# Patient Record
Sex: Female | Born: 1940
Health system: Southern US, Community
[De-identification: ages and names within clinical notes are randomized; demographics above are authoritative.]

## PROBLEM LIST (undated history)

## (undated) DIAGNOSIS — J309 Allergic rhinitis, unspecified: Secondary | ICD-10-CM

## (undated) HISTORY — DX: Allergic rhinitis, unspecified: J30.9

---

## 1999-10-13 ENCOUNTER — Encounter: Payer: Self-pay | Admitting: Internal Medicine

## 1999-10-13 ENCOUNTER — Encounter: Admission: RE | Admit: 1999-10-13 | Discharge: 1999-10-13 | Payer: Self-pay | Admitting: Internal Medicine

## 2000-12-04 ENCOUNTER — Encounter: Admission: RE | Admit: 2000-12-04 | Discharge: 2000-12-04 | Payer: Self-pay | Admitting: Internal Medicine

## 2000-12-04 ENCOUNTER — Encounter: Payer: Self-pay | Admitting: Internal Medicine

## 2001-12-21 ENCOUNTER — Encounter: Payer: Self-pay | Admitting: Internal Medicine

## 2001-12-21 ENCOUNTER — Encounter: Admission: RE | Admit: 2001-12-21 | Discharge: 2001-12-21 | Payer: Self-pay | Admitting: Internal Medicine

## 2002-05-01 ENCOUNTER — Other Ambulatory Visit: Admission: RE | Admit: 2002-05-01 | Discharge: 2002-05-01 | Payer: Self-pay | Admitting: Obstetrics and Gynecology

## 2003-05-01 ENCOUNTER — Encounter: Admission: RE | Admit: 2003-05-01 | Discharge: 2003-05-01 | Payer: Self-pay | Admitting: Internal Medicine

## 2003-05-01 ENCOUNTER — Encounter: Payer: Self-pay | Admitting: Internal Medicine

## 2003-06-02 ENCOUNTER — Other Ambulatory Visit: Admission: RE | Admit: 2003-06-02 | Discharge: 2003-06-02 | Payer: Self-pay | Admitting: Family Medicine

## 2004-05-07 ENCOUNTER — Encounter: Admission: RE | Admit: 2004-05-07 | Discharge: 2004-05-07 | Payer: Self-pay | Admitting: Family Medicine

## 2004-06-07 ENCOUNTER — Ambulatory Visit: Payer: Self-pay | Admitting: Internal Medicine

## 2004-09-13 ENCOUNTER — Other Ambulatory Visit: Admission: RE | Admit: 2004-09-13 | Discharge: 2004-09-13 | Payer: Self-pay | Admitting: Family Medicine

## 2005-04-14 ENCOUNTER — Encounter: Admission: RE | Admit: 2005-04-14 | Discharge: 2005-04-14 | Payer: Self-pay | Admitting: Family Medicine

## 2007-05-10 ENCOUNTER — Encounter: Admission: RE | Admit: 2007-05-10 | Discharge: 2007-05-10 | Payer: Self-pay | Admitting: Family Medicine

## 2008-05-21 ENCOUNTER — Encounter: Admission: RE | Admit: 2008-05-21 | Discharge: 2008-05-21 | Payer: Self-pay | Admitting: Internal Medicine

## 2010-05-11 ENCOUNTER — Encounter: Admission: RE | Admit: 2010-05-11 | Discharge: 2010-05-11 | Payer: Self-pay | Admitting: Internal Medicine

## 2010-08-08 ENCOUNTER — Encounter: Payer: Self-pay | Admitting: Family Medicine

## 2011-09-16 DIAGNOSIS — B9789 Other viral agents as the cause of diseases classified elsewhere: Secondary | ICD-10-CM | POA: Diagnosis not present

## 2011-09-16 DIAGNOSIS — I10 Essential (primary) hypertension: Secondary | ICD-10-CM | POA: Diagnosis not present

## 2011-11-16 DIAGNOSIS — M899 Disorder of bone, unspecified: Secondary | ICD-10-CM | POA: Diagnosis not present

## 2011-11-16 DIAGNOSIS — I10 Essential (primary) hypertension: Secondary | ICD-10-CM | POA: Diagnosis not present

## 2011-11-16 DIAGNOSIS — M949 Disorder of cartilage, unspecified: Secondary | ICD-10-CM | POA: Diagnosis not present

## 2011-11-22 DIAGNOSIS — M1990 Unspecified osteoarthritis, unspecified site: Secondary | ICD-10-CM | POA: Diagnosis not present

## 2011-11-22 DIAGNOSIS — I10 Essential (primary) hypertension: Secondary | ICD-10-CM | POA: Diagnosis not present

## 2011-11-22 DIAGNOSIS — M949 Disorder of cartilage, unspecified: Secondary | ICD-10-CM | POA: Diagnosis not present

## 2011-11-22 DIAGNOSIS — M899 Disorder of bone, unspecified: Secondary | ICD-10-CM | POA: Diagnosis not present

## 2011-11-22 DIAGNOSIS — Z0000 Encounter for general adult medical examination without abnormal findings: Secondary | ICD-10-CM | POA: Diagnosis not present

## 2011-11-22 DIAGNOSIS — J309 Allergic rhinitis, unspecified: Secondary | ICD-10-CM | POA: Diagnosis not present

## 2011-11-28 DIAGNOSIS — Z1212 Encounter for screening for malignant neoplasm of rectum: Secondary | ICD-10-CM | POA: Diagnosis not present

## 2011-12-09 ENCOUNTER — Other Ambulatory Visit: Payer: Self-pay | Admitting: Internal Medicine

## 2011-12-09 DIAGNOSIS — Z1231 Encounter for screening mammogram for malignant neoplasm of breast: Secondary | ICD-10-CM

## 2011-12-15 DIAGNOSIS — IMO0002 Reserved for concepts with insufficient information to code with codable children: Secondary | ICD-10-CM | POA: Diagnosis not present

## 2012-03-20 ENCOUNTER — Ambulatory Visit: Payer: Self-pay

## 2012-03-27 ENCOUNTER — Ambulatory Visit
Admission: RE | Admit: 2012-03-27 | Discharge: 2012-03-27 | Disposition: A | Discharge status: Home / Self Care | Payer: Medicare Other | Source: Ambulatory Visit | Attending: Internal Medicine | Admitting: Internal Medicine

## 2012-03-27 DIAGNOSIS — Z1231 Encounter for screening mammogram for malignant neoplasm of breast: Secondary | ICD-10-CM

## 2012-04-26 DIAGNOSIS — H04129 Dry eye syndrome of unspecified lacrimal gland: Secondary | ICD-10-CM | POA: Diagnosis not present

## 2012-04-26 DIAGNOSIS — H40019 Open angle with borderline findings, low risk, unspecified eye: Secondary | ICD-10-CM | POA: Diagnosis not present

## 2012-04-26 DIAGNOSIS — H2510 Age-related nuclear cataract, unspecified eye: Secondary | ICD-10-CM | POA: Diagnosis not present

## 2012-06-09 DIAGNOSIS — Z23 Encounter for immunization: Secondary | ICD-10-CM | POA: Diagnosis not present

## 2012-07-02 DIAGNOSIS — L570 Actinic keratosis: Secondary | ICD-10-CM | POA: Diagnosis not present

## 2012-07-02 DIAGNOSIS — L723 Sebaceous cyst: Secondary | ICD-10-CM | POA: Diagnosis not present

## 2012-07-02 DIAGNOSIS — L821 Other seborrheic keratosis: Secondary | ICD-10-CM | POA: Diagnosis not present

## 2012-07-27 DIAGNOSIS — M25559 Pain in unspecified hip: Secondary | ICD-10-CM | POA: Diagnosis not present

## 2012-10-08 DIAGNOSIS — M25559 Pain in unspecified hip: Secondary | ICD-10-CM | POA: Diagnosis not present

## 2012-10-17 DIAGNOSIS — IMO0002 Reserved for concepts with insufficient information to code with codable children: Secondary | ICD-10-CM | POA: Diagnosis not present

## 2012-10-24 DIAGNOSIS — IMO0002 Reserved for concepts with insufficient information to code with codable children: Secondary | ICD-10-CM | POA: Diagnosis not present

## 2012-11-18 ENCOUNTER — Emergency Department (HOSPITAL_COMMUNITY)
Admission: EM | Admit: 2012-11-18 | Discharge: 2012-11-18 | Disposition: A | Discharge status: Home / Self Care | Payer: Medicare Other | Attending: Emergency Medicine | Admitting: Emergency Medicine

## 2012-11-18 ENCOUNTER — Encounter (HOSPITAL_COMMUNITY): Payer: Self-pay | Admitting: Emergency Medicine

## 2012-11-18 DIAGNOSIS — K921 Melena: Secondary | ICD-10-CM | POA: Diagnosis not present

## 2012-11-18 DIAGNOSIS — K625 Hemorrhage of anus and rectum: Secondary | ICD-10-CM | POA: Diagnosis not present

## 2012-11-18 LAB — CBC WITH DIFFERENTIAL/PLATELET
Basophils Absolute: 0 10*3/uL (ref 0.0–0.1)
Eosinophils Absolute: 0.1 10*3/uL (ref 0.0–0.7)
Eosinophils Relative: 3 % (ref 0–5)
Hemoglobin: 13.8 g/dL (ref 12.0–15.0)
Lymphocytes Relative: 45 % (ref 12–46)
MCH: 30 pg (ref 26.0–34.0)
Monocytes Absolute: 0.5 10*3/uL (ref 0.1–1.0)
Monocytes Relative: 9 % (ref 3–12)
Neutro Abs: 2.5 10*3/uL (ref 1.7–7.7)
Platelets: 244 10*3/uL (ref 150–400)
WBC: 5.6 10*3/uL (ref 4.0–10.5)

## 2012-11-18 LAB — BASIC METABOLIC PANEL
BUN: 17 mg/dL (ref 6–23)
CO2: 27 mEq/L (ref 19–32)
Creatinine, Ser: 0.72 mg/dL (ref 0.50–1.10)
GFR calc Af Amer: >90 mL/min (ref >90)
GFR calc non Af Amer: 84 mL/min — ABNORMAL LOW (ref >90)

## 2012-11-18 LAB — OCCULT BLOOD, POC DEVICE: Fecal Occult Bld: NEGATIVE

## 2012-11-18 NOTE — ED Notes (Signed)
Pt states that she noticed blood in stools that started last night. States that this morning the same. States that she was suppose to go for yearly physical tomorrow. NAD at this time.

## 2012-11-18 NOTE — ED Provider Notes (Signed)
History     CSN: [id number]  Arrival date & time 11/18/12  1103   First MD Initiated Contact with Patient 11/18/12 1141      Chief Complaint  Patient presents with  . Rectal Bleeding    (Consider location/radiation/quality/duration/timing/severity/associated sxs/prior treatment) HPI Comments: PADME ARRIAGA is a 72 y.o. Female who presents for evaluation of blood in stool. She noticed red colored blood mixed with stool twice in the last 20 hours. No associated fever, chills, cough, shortness of breath, chest pain, weakness, or dizziness. She denies abdominal pain. No history of same. There no known modifying factors.  Patient is a 72 y.o. female presenting with hematochezia. The history is provided by the patient.  Rectal Bleeding     History reviewed. No pertinent past medical history.  History reviewed. No pertinent past surgical history.  No family history on file.  History  Substance Use Topics  . Smoking status: Not on file  . Smokeless tobacco: Not on file  . Alcohol Use: Not on file    OB History   Grav Para Term Preterm Abortions TAB SAB Ect Mult Living                  Review of Systems  Gastrointestinal: Positive for hematochezia.  All other systems reviewed and are negative.    Allergies  Sulfa antibiotics  Home Medications   Current Outpatient Rx  Name  Route  Sig  Dispense  Refill  . Cholecalciferol (VITAMIN D-3) 5000 UNITS TABS   Oral   Take 1 tablet by mouth daily.           BP 150/77  Pulse 77  Temp(Src) 97.8 F (36.6 C) (Oral)  Resp 20  SpO2 98%  Physical Exam  Nursing note and vitals reviewed. Constitutional: She is oriented to person, place, and time. She appears well-developed and well-nourished.  HENT:  Head: Normocephalic and atraumatic.  Eyes: Conjunctivae and EOM are normal. Pupils are equal, round, and reactive to light.  Neck: Normal range of motion and phonation normal. Neck supple.  Cardiovascular: Normal rate,  regular rhythm and intact distal pulses.   Pulmonary/Chest: Effort normal and breath sounds normal. She exhibits no tenderness.  Abdominal: Soft. She exhibits no distension. There is no tenderness. There is no guarding.  Genitourinary:  Normal hands, without hemorrhoids or fissure. Rectal exam reveals soft brown stool without mass palpated.  Musculoskeletal: Normal range of motion.  Neurological: She is alert and oriented to person, place, and time. She has normal strength. She exhibits normal muscle tone.  Skin: Skin is warm and dry.  Psychiatric: She has a normal mood and affect. Her behavior is normal. Judgment and thought content normal.    ED Course  Procedures (including critical care time)  Patient brought a stool sample from home, for testing.  After initial exam, she recalled eating A large amount of strawberries yesterday.  Labs Reviewed  BASIC METABOLIC PANEL - Abnormal; Notable for the following:    Glucose, Bld 104 (*)    GFR calc non Af Amer 84 (*)    All other components within normal limits  CBC WITH DIFFERENTIAL  OCCULT BLOOD, POC DEVICE  OCCULT BLOOD, POC DEVICE      1. Rectal bleeding       MDM  Reported rectal bleeding with exam. She appears to have a benign process. Both samples on rectal exam, and the one she brought in, were negative for blood. Hemoglobin is normal. Doubt metabolic instability,  serious bacterial infection or impending vascular collapse; the patient is stable for discharge.  Nursing Notes Reviewed/ Care Coordinated, and agree without changes. Applicable Imaging Reviewed.  Interpretation of Laboratory Data incorporated into ED treatment   Plan: Home Medications- usual; Home Treatments- rest; Recommended follow up- PCP prn          Flint Melter, MD 11/18/12 1244

## 2012-11-19 DIAGNOSIS — I10 Essential (primary) hypertension: Secondary | ICD-10-CM | POA: Diagnosis not present

## 2012-11-19 DIAGNOSIS — M899 Disorder of bone, unspecified: Secondary | ICD-10-CM | POA: Diagnosis not present

## 2012-11-19 DIAGNOSIS — M949 Disorder of cartilage, unspecified: Secondary | ICD-10-CM | POA: Diagnosis not present

## 2012-11-26 DIAGNOSIS — M25559 Pain in unspecified hip: Secondary | ICD-10-CM | POA: Diagnosis not present

## 2012-11-26 DIAGNOSIS — L408 Other psoriasis: Secondary | ICD-10-CM | POA: Diagnosis not present

## 2012-11-26 DIAGNOSIS — Z6825 Body mass index (BMI) 25.0-25.9, adult: Secondary | ICD-10-CM | POA: Diagnosis not present

## 2012-11-26 DIAGNOSIS — Z0000 Encounter for general adult medical examination without abnormal findings: Secondary | ICD-10-CM | POA: Diagnosis not present

## 2012-11-26 DIAGNOSIS — I10 Essential (primary) hypertension: Secondary | ICD-10-CM | POA: Diagnosis not present

## 2012-11-26 DIAGNOSIS — Z1331 Encounter for screening for depression: Secondary | ICD-10-CM | POA: Diagnosis not present

## 2012-11-26 DIAGNOSIS — M899 Disorder of bone, unspecified: Secondary | ICD-10-CM | POA: Diagnosis not present

## 2012-11-26 DIAGNOSIS — J309 Allergic rhinitis, unspecified: Secondary | ICD-10-CM | POA: Diagnosis not present

## 2012-11-26 DIAGNOSIS — M949 Disorder of cartilage, unspecified: Secondary | ICD-10-CM | POA: Diagnosis not present

## 2012-11-27 DIAGNOSIS — Z1212 Encounter for screening for malignant neoplasm of rectum: Secondary | ICD-10-CM | POA: Diagnosis not present

## 2012-12-19 DIAGNOSIS — H40019 Open angle with borderline findings, low risk, unspecified eye: Secondary | ICD-10-CM | POA: Diagnosis not present

## 2012-12-19 DIAGNOSIS — H04129 Dry eye syndrome of unspecified lacrimal gland: Secondary | ICD-10-CM | POA: Diagnosis not present

## 2012-12-19 DIAGNOSIS — H1045 Other chronic allergic conjunctivitis: Secondary | ICD-10-CM | POA: Diagnosis not present

## 2013-03-27 DIAGNOSIS — H02839 Dermatochalasis of unspecified eye, unspecified eyelid: Secondary | ICD-10-CM | POA: Diagnosis not present

## 2013-03-27 DIAGNOSIS — H2510 Age-related nuclear cataract, unspecified eye: Secondary | ICD-10-CM | POA: Diagnosis not present

## 2013-03-27 DIAGNOSIS — H18419 Arcus senilis, unspecified eye: Secondary | ICD-10-CM | POA: Diagnosis not present

## 2013-03-27 DIAGNOSIS — H04129 Dry eye syndrome of unspecified lacrimal gland: Secondary | ICD-10-CM | POA: Diagnosis not present

## 2013-04-18 DIAGNOSIS — D239 Other benign neoplasm of skin, unspecified: Secondary | ICD-10-CM | POA: Diagnosis not present

## 2013-04-18 DIAGNOSIS — L820 Inflamed seborrheic keratosis: Secondary | ICD-10-CM | POA: Diagnosis not present

## 2013-04-18 DIAGNOSIS — L819 Disorder of pigmentation, unspecified: Secondary | ICD-10-CM | POA: Diagnosis not present

## 2013-04-18 DIAGNOSIS — L821 Other seborrheic keratosis: Secondary | ICD-10-CM | POA: Diagnosis not present

## 2013-05-27 DIAGNOSIS — H01029 Squamous blepharitis unspecified eye, unspecified eyelid: Secondary | ICD-10-CM | POA: Diagnosis not present

## 2013-05-27 DIAGNOSIS — H02419 Mechanical ptosis of unspecified eyelid: Secondary | ICD-10-CM | POA: Diagnosis not present

## 2013-05-27 DIAGNOSIS — H11439 Conjunctival hyperemia, unspecified eye: Secondary | ICD-10-CM | POA: Diagnosis not present

## 2013-05-27 DIAGNOSIS — H02839 Dermatochalasis of unspecified eye, unspecified eyelid: Secondary | ICD-10-CM | POA: Diagnosis not present

## 2013-10-07 DIAGNOSIS — H2510 Age-related nuclear cataract, unspecified eye: Secondary | ICD-10-CM | POA: Diagnosis not present

## 2013-10-07 DIAGNOSIS — H18419 Arcus senilis, unspecified eye: Secondary | ICD-10-CM | POA: Diagnosis not present

## 2013-10-07 DIAGNOSIS — H25019 Cortical age-related cataract, unspecified eye: Secondary | ICD-10-CM | POA: Diagnosis not present

## 2013-10-07 DIAGNOSIS — H43819 Vitreous degeneration, unspecified eye: Secondary | ICD-10-CM | POA: Diagnosis not present

## 2013-11-25 DIAGNOSIS — I10 Essential (primary) hypertension: Secondary | ICD-10-CM | POA: Diagnosis not present

## 2013-11-25 DIAGNOSIS — R82998 Other abnormal findings in urine: Secondary | ICD-10-CM | POA: Diagnosis not present

## 2013-11-25 DIAGNOSIS — M899 Disorder of bone, unspecified: Secondary | ICD-10-CM | POA: Diagnosis not present

## 2013-11-25 DIAGNOSIS — R809 Proteinuria, unspecified: Secondary | ICD-10-CM | POA: Diagnosis not present

## 2013-11-25 DIAGNOSIS — M949 Disorder of cartilage, unspecified: Secondary | ICD-10-CM | POA: Diagnosis not present

## 2013-12-02 DIAGNOSIS — M25559 Pain in unspecified hip: Secondary | ICD-10-CM | POA: Diagnosis not present

## 2013-12-02 DIAGNOSIS — M899 Disorder of bone, unspecified: Secondary | ICD-10-CM | POA: Diagnosis not present

## 2013-12-02 DIAGNOSIS — I10 Essential (primary) hypertension: Secondary | ICD-10-CM | POA: Diagnosis not present

## 2013-12-02 DIAGNOSIS — H269 Unspecified cataract: Secondary | ICD-10-CM | POA: Diagnosis not present

## 2013-12-02 DIAGNOSIS — M1990 Unspecified osteoarthritis, unspecified site: Secondary | ICD-10-CM | POA: Diagnosis not present

## 2013-12-02 DIAGNOSIS — M949 Disorder of cartilage, unspecified: Secondary | ICD-10-CM | POA: Diagnosis not present

## 2013-12-02 DIAGNOSIS — N3090 Cystitis, unspecified without hematuria: Secondary | ICD-10-CM | POA: Diagnosis not present

## 2013-12-02 DIAGNOSIS — L408 Other psoriasis: Secondary | ICD-10-CM | POA: Diagnosis not present

## 2013-12-02 DIAGNOSIS — Z0000 Encounter for general adult medical examination without abnormal findings: Secondary | ICD-10-CM | POA: Diagnosis not present

## 2013-12-05 DIAGNOSIS — Z1212 Encounter for screening for malignant neoplasm of rectum: Secondary | ICD-10-CM | POA: Diagnosis not present

## 2014-01-06 DIAGNOSIS — L84 Corns and callosities: Secondary | ICD-10-CM | POA: Diagnosis not present

## 2014-01-06 DIAGNOSIS — IMO0002 Reserved for concepts with insufficient information to code with codable children: Secondary | ICD-10-CM | POA: Diagnosis not present

## 2014-01-06 DIAGNOSIS — I10 Essential (primary) hypertension: Secondary | ICD-10-CM | POA: Diagnosis not present

## 2014-02-17 ENCOUNTER — Ambulatory Visit (INDEPENDENT_AMBULATORY_CARE_PROVIDER_SITE_OTHER): Payer: Medicare Other | Admitting: Podiatry

## 2014-02-17 ENCOUNTER — Encounter: Payer: Self-pay | Admitting: Podiatry

## 2014-02-17 VITALS — BP 133/80 | HR 77 | Resp 13 | Ht 65.0 in | Wt 146.0 lb

## 2014-02-17 DIAGNOSIS — L84 Corns and callosities: Secondary | ICD-10-CM | POA: Diagnosis not present

## 2014-02-17 DIAGNOSIS — M2040 Other hammer toe(s) (acquired), unspecified foot: Secondary | ICD-10-CM | POA: Diagnosis not present

## 2014-02-17 NOTE — Patient Instructions (Signed)
Corns and Calluses Corns are small areas of thickened skin that usually occur on the top, sides, or tip of a toe. They contain a cone-shaped core with a point that can press on a nerve below. This causes pain. Calluses are areas of thickened skin that usually develop on hands, fingers, palms, soles of the feet, and heels. These are areas that experience frequent friction or pressure. CAUSES  Corns are usually the result of rubbing (friction) or pressure from shoes that are too tight or do not fit properly. Calluses are caused by repeated friction and pressure on the affected areas. SYMPTOMS  A hard growth on the skin.  Pain or tenderness under the skin.  Sometimes, redness and swelling.  Increased discomfort while wearing tight-fitting shoes. DIAGNOSIS  Your caregiver can usually tell what the problem is by doing a physical exam. TREATMENT  Removing the cause of the friction or pressure is usually the only treatment needed. However, sometimes medicines can be used to help soften the hardened, thickened areas. These medicines include salicylic acid plasters and 12% ammonium lactate lotion. These medicines should only be used under the direction of your caregiver. HOME CARE INSTRUCTIONS   Try to remove pressure from the affected area.  You may wear donut-shaped corn pads to protect your skin.  You may use a pumice stone or nonmetallic nail file to gently reduce the thickness of a corn.  Wear properly fitted footwear.  If you have calluses on the hands, wear gloves during activities that cause friction.  If you have diabetes, you should regularly examine your feet. Tell your caregiver if you notice any problems with your feet. SEEK IMMEDIATE MEDICAL CARE IF:   You have increased pain, swelling, redness, or warmth in the affected area.  Your corn or callus starts to drain fluid or bleeds.  You are not getting better, even with treatment. Document Released: 04/09/2004 Document  Revised: 09/26/2011 Document Reviewed: 03/01/2011 ExitCare Patient Information 2015 [employer]. This information is not intended to replace advice given to you by your health care provider. Make sure you discuss any questions you have with your health care provider.  

## 2014-02-17 NOTE — Progress Notes (Signed)
   Subjective:    Patient ID: Annette Key, female    DOB: 1941-04-20, 73 y.o.   MRN: [id number]  HPI Comments: N corn L left 4th lateral toe D 01/04/2014 O while touring in [place] in the heat C hard painful skin A worse after use of medicated Dr. Felicie Morn corn pad T Dr. Darrick Grinder' medicated corn pad     Review of Systems  All other systems reviewed and are negative.      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

## 2014-03-05 DIAGNOSIS — H2510 Age-related nuclear cataract, unspecified eye: Secondary | ICD-10-CM | POA: Diagnosis not present

## 2014-03-05 DIAGNOSIS — H269 Unspecified cataract: Secondary | ICD-10-CM | POA: Diagnosis not present

## 2014-03-11 DIAGNOSIS — H269 Unspecified cataract: Secondary | ICD-10-CM | POA: Diagnosis not present

## 2014-03-11 DIAGNOSIS — H2510 Age-related nuclear cataract, unspecified eye: Secondary | ICD-10-CM | POA: Diagnosis not present

## 2014-04-01 DIAGNOSIS — M899 Disorder of bone, unspecified: Secondary | ICD-10-CM | POA: Diagnosis not present

## 2014-04-01 DIAGNOSIS — M949 Disorder of cartilage, unspecified: Secondary | ICD-10-CM | POA: Diagnosis not present

## 2014-08-06 DIAGNOSIS — D225 Melanocytic nevi of trunk: Secondary | ICD-10-CM | POA: Diagnosis not present

## 2014-08-06 DIAGNOSIS — L309 Dermatitis, unspecified: Secondary | ICD-10-CM | POA: Diagnosis not present

## 2014-08-06 DIAGNOSIS — L821 Other seborrheic keratosis: Secondary | ICD-10-CM | POA: Diagnosis not present

## 2014-09-17 DIAGNOSIS — L821 Other seborrheic keratosis: Secondary | ICD-10-CM | POA: Diagnosis not present

## 2014-09-17 DIAGNOSIS — L814 Other melanin hyperpigmentation: Secondary | ICD-10-CM | POA: Diagnosis not present

## 2015-01-06 DIAGNOSIS — Z1389 Encounter for screening for other disorder: Secondary | ICD-10-CM | POA: Diagnosis not present

## 2015-01-06 DIAGNOSIS — J309 Allergic rhinitis, unspecified: Secondary | ICD-10-CM | POA: Diagnosis not present

## 2015-01-06 DIAGNOSIS — I10 Essential (primary) hypertension: Secondary | ICD-10-CM | POA: Diagnosis not present

## 2015-01-06 DIAGNOSIS — Z23 Encounter for immunization: Secondary | ICD-10-CM | POA: Diagnosis not present

## 2015-01-06 DIAGNOSIS — M859 Disorder of bone density and structure, unspecified: Secondary | ICD-10-CM | POA: Diagnosis not present

## 2015-01-06 DIAGNOSIS — Z6824 Body mass index (BMI) 24.0-24.9, adult: Secondary | ICD-10-CM | POA: Diagnosis not present

## 2015-01-06 DIAGNOSIS — Z0000 Encounter for general adult medical examination without abnormal findings: Secondary | ICD-10-CM | POA: Diagnosis not present

## 2015-01-06 DIAGNOSIS — Z01419 Encounter for gynecological examination (general) (routine) without abnormal findings: Secondary | ICD-10-CM | POA: Diagnosis not present

## 2015-01-13 ENCOUNTER — Other Ambulatory Visit: Payer: Self-pay

## 2015-01-13 DIAGNOSIS — Z1231 Encounter for screening mammogram for malignant neoplasm of breast: Secondary | ICD-10-CM

## 2015-01-16 ENCOUNTER — Ambulatory Visit
Admission: RE | Admit: 2015-01-16 | Discharge: 2015-01-16 | Disposition: A | Discharge status: Home / Self Care | Payer: Medicare Other | Source: Ambulatory Visit

## 2015-01-16 ENCOUNTER — Ambulatory Visit: Payer: Medicare Other

## 2015-01-16 DIAGNOSIS — Z1231 Encounter for screening mammogram for malignant neoplasm of breast: Secondary | ICD-10-CM

## 2015-03-11 DIAGNOSIS — M7062 Trochanteric bursitis, left hip: Secondary | ICD-10-CM | POA: Diagnosis not present

## 2015-03-11 DIAGNOSIS — M545 Low back pain: Secondary | ICD-10-CM | POA: Diagnosis not present

## 2015-04-13 DIAGNOSIS — M25552 Pain in left hip: Secondary | ICD-10-CM | POA: Diagnosis not present

## 2015-04-15 DIAGNOSIS — M810 Age-related osteoporosis without current pathological fracture: Secondary | ICD-10-CM | POA: Diagnosis not present

## 2015-04-15 DIAGNOSIS — M7062 Trochanteric bursitis, left hip: Secondary | ICD-10-CM | POA: Diagnosis not present

## 2015-04-15 DIAGNOSIS — M545 Low back pain: Secondary | ICD-10-CM | POA: Diagnosis not present

## 2015-04-16 DIAGNOSIS — M25552 Pain in left hip: Secondary | ICD-10-CM | POA: Diagnosis not present

## 2015-04-22 DIAGNOSIS — L570 Actinic keratosis: Secondary | ICD-10-CM | POA: Diagnosis not present

## 2015-04-22 DIAGNOSIS — L821 Other seborrheic keratosis: Secondary | ICD-10-CM | POA: Diagnosis not present

## 2015-04-22 DIAGNOSIS — L814 Other melanin hyperpigmentation: Secondary | ICD-10-CM | POA: Diagnosis not present

## 2015-04-23 DIAGNOSIS — M25552 Pain in left hip: Secondary | ICD-10-CM | POA: Diagnosis not present

## 2015-04-27 DIAGNOSIS — H04122 Dry eye syndrome of left lacrimal gland: Secondary | ICD-10-CM | POA: Diagnosis not present

## 2015-04-27 DIAGNOSIS — H43812 Vitreous degeneration, left eye: Secondary | ICD-10-CM | POA: Diagnosis not present

## 2015-04-27 DIAGNOSIS — H26492 Other secondary cataract, left eye: Secondary | ICD-10-CM | POA: Diagnosis not present

## 2015-04-27 DIAGNOSIS — H35371 Puckering of macula, right eye: Secondary | ICD-10-CM | POA: Diagnosis not present

## 2015-04-27 DIAGNOSIS — H04121 Dry eye syndrome of right lacrimal gland: Secondary | ICD-10-CM | POA: Diagnosis not present

## 2015-04-27 DIAGNOSIS — H43811 Vitreous degeneration, right eye: Secondary | ICD-10-CM | POA: Diagnosis not present

## 2015-04-27 DIAGNOSIS — H26491 Other secondary cataract, right eye: Secondary | ICD-10-CM | POA: Diagnosis not present

## 2015-04-29 DIAGNOSIS — M25552 Pain in left hip: Secondary | ICD-10-CM | POA: Diagnosis not present

## 2015-05-07 DIAGNOSIS — M25552 Pain in left hip: Secondary | ICD-10-CM | POA: Diagnosis not present

## 2015-05-08 ENCOUNTER — Encounter (INDEPENDENT_AMBULATORY_CARE_PROVIDER_SITE_OTHER): Payer: Medicare Other | Admitting: Ophthalmology

## 2015-05-08 DIAGNOSIS — H43813 Vitreous degeneration, bilateral: Secondary | ICD-10-CM

## 2015-05-08 DIAGNOSIS — H35373 Puckering of macula, bilateral: Secondary | ICD-10-CM | POA: Diagnosis not present

## 2015-05-14 DIAGNOSIS — M25552 Pain in left hip: Secondary | ICD-10-CM | POA: Diagnosis not present

## 2015-05-18 DIAGNOSIS — M25552 Pain in left hip: Secondary | ICD-10-CM | POA: Diagnosis not present

## 2015-05-20 DIAGNOSIS — M25552 Pain in left hip: Secondary | ICD-10-CM | POA: Diagnosis not present

## 2015-05-28 DIAGNOSIS — M25552 Pain in left hip: Secondary | ICD-10-CM | POA: Diagnosis not present

## 2015-10-12 DIAGNOSIS — L821 Other seborrheic keratosis: Secondary | ICD-10-CM | POA: Diagnosis not present

## 2015-10-12 DIAGNOSIS — L578 Other skin changes due to chronic exposure to nonionizing radiation: Secondary | ICD-10-CM | POA: Diagnosis not present

## 2015-10-12 DIAGNOSIS — D225 Melanocytic nevi of trunk: Secondary | ICD-10-CM | POA: Diagnosis not present

## 2015-10-12 DIAGNOSIS — L814 Other melanin hyperpigmentation: Secondary | ICD-10-CM | POA: Diagnosis not present

## 2015-10-30 DIAGNOSIS — H02833 Dermatochalasis of right eye, unspecified eyelid: Secondary | ICD-10-CM | POA: Diagnosis not present

## 2015-10-30 DIAGNOSIS — H04123 Dry eye syndrome of bilateral lacrimal glands: Secondary | ICD-10-CM | POA: Diagnosis not present

## 2015-10-30 DIAGNOSIS — H40013 Open angle with borderline findings, low risk, bilateral: Secondary | ICD-10-CM | POA: Diagnosis not present

## 2015-10-30 DIAGNOSIS — Z961 Presence of intraocular lens: Secondary | ICD-10-CM | POA: Diagnosis not present

## 2015-10-30 DIAGNOSIS — H02403 Unspecified ptosis of bilateral eyelids: Secondary | ICD-10-CM | POA: Diagnosis not present

## 2016-01-18 DIAGNOSIS — H04123 Dry eye syndrome of bilateral lacrimal glands: Secondary | ICD-10-CM | POA: Diagnosis not present

## 2016-01-18 DIAGNOSIS — H40013 Open angle with borderline findings, low risk, bilateral: Secondary | ICD-10-CM | POA: Diagnosis not present

## 2016-08-16 DIAGNOSIS — D225 Melanocytic nevi of trunk: Secondary | ICD-10-CM | POA: Diagnosis not present

## 2016-08-16 DIAGNOSIS — L570 Actinic keratosis: Secondary | ICD-10-CM | POA: Diagnosis not present

## 2016-08-16 DIAGNOSIS — L300 Nummular dermatitis: Secondary | ICD-10-CM | POA: Diagnosis not present

## 2016-08-16 DIAGNOSIS — L821 Other seborrheic keratosis: Secondary | ICD-10-CM | POA: Diagnosis not present

## 2016-09-21 DIAGNOSIS — H26493 Other secondary cataract, bilateral: Secondary | ICD-10-CM | POA: Diagnosis not present

## 2016-09-21 DIAGNOSIS — H43813 Vitreous degeneration, bilateral: Secondary | ICD-10-CM | POA: Diagnosis not present

## 2016-09-21 DIAGNOSIS — H26492 Other secondary cataract, left eye: Secondary | ICD-10-CM | POA: Diagnosis not present

## 2016-09-26 DIAGNOSIS — M859 Disorder of bone density and structure, unspecified: Secondary | ICD-10-CM | POA: Diagnosis not present

## 2016-09-26 DIAGNOSIS — I10 Essential (primary) hypertension: Secondary | ICD-10-CM | POA: Diagnosis not present

## 2016-10-03 DIAGNOSIS — L84 Corns and callosities: Secondary | ICD-10-CM | POA: Diagnosis not present

## 2016-10-03 DIAGNOSIS — H268 Other specified cataract: Secondary | ICD-10-CM | POA: Diagnosis not present

## 2016-10-03 DIAGNOSIS — M859 Disorder of bone density and structure, unspecified: Secondary | ICD-10-CM | POA: Diagnosis not present

## 2016-10-03 DIAGNOSIS — L408 Other psoriasis: Secondary | ICD-10-CM | POA: Diagnosis not present

## 2016-10-03 DIAGNOSIS — M1990 Unspecified osteoarthritis, unspecified site: Secondary | ICD-10-CM | POA: Diagnosis not present

## 2016-10-03 DIAGNOSIS — Z0000 Encounter for general adult medical examination without abnormal findings: Secondary | ICD-10-CM | POA: Diagnosis not present

## 2016-10-03 DIAGNOSIS — H26491 Other secondary cataract, right eye: Secondary | ICD-10-CM | POA: Diagnosis not present

## 2016-10-03 DIAGNOSIS — Z6824 Body mass index (BMI) 24.0-24.9, adult: Secondary | ICD-10-CM | POA: Diagnosis not present

## 2016-10-03 DIAGNOSIS — I10 Essential (primary) hypertension: Secondary | ICD-10-CM | POA: Diagnosis not present

## 2016-10-03 DIAGNOSIS — Z1389 Encounter for screening for other disorder: Secondary | ICD-10-CM | POA: Diagnosis not present

## 2016-10-03 DIAGNOSIS — J3089 Other allergic rhinitis: Secondary | ICD-10-CM | POA: Diagnosis not present

## 2016-10-04 ENCOUNTER — Other Ambulatory Visit: Payer: Self-pay | Admitting: Internal Medicine

## 2016-10-04 DIAGNOSIS — M859 Disorder of bone density and structure, unspecified: Secondary | ICD-10-CM | POA: Diagnosis not present

## 2016-10-04 DIAGNOSIS — Z1212 Encounter for screening for malignant neoplasm of rectum: Secondary | ICD-10-CM | POA: Diagnosis not present

## 2016-10-04 DIAGNOSIS — Z1231 Encounter for screening mammogram for malignant neoplasm of breast: Secondary | ICD-10-CM

## 2016-10-21 ENCOUNTER — Ambulatory Visit: Payer: Medicare Other

## 2016-10-21 ENCOUNTER — Ambulatory Visit
Admission: RE | Admit: 2016-10-21 | Discharge: 2016-10-21 | Disposition: A | Discharge status: Home / Self Care | Payer: PPO | Source: Ambulatory Visit | Attending: Internal Medicine | Admitting: Internal Medicine

## 2016-10-21 ENCOUNTER — Other Ambulatory Visit: Payer: Self-pay | Admitting: Internal Medicine

## 2016-10-21 DIAGNOSIS — Z1231 Encounter for screening mammogram for malignant neoplasm of breast: Secondary | ICD-10-CM

## 2017-01-20 ENCOUNTER — Ambulatory Visit (INDEPENDENT_AMBULATORY_CARE_PROVIDER_SITE_OTHER): Payer: PPO

## 2017-01-20 ENCOUNTER — Ambulatory Visit (INDEPENDENT_AMBULATORY_CARE_PROVIDER_SITE_OTHER): Payer: PPO | Admitting: Podiatry

## 2017-01-20 ENCOUNTER — Telehealth: Payer: Self-pay | Admitting: Podiatry

## 2017-01-20 ENCOUNTER — Encounter: Payer: Self-pay | Admitting: Podiatry

## 2017-01-20 DIAGNOSIS — M779 Enthesopathy, unspecified: Secondary | ICD-10-CM

## 2017-01-20 DIAGNOSIS — L84 Corns and callosities: Secondary | ICD-10-CM

## 2017-01-20 DIAGNOSIS — M79672 Pain in left foot: Secondary | ICD-10-CM | POA: Diagnosis not present

## 2017-01-20 MED ORDER — TRIAMCINOLONE ACETONIDE 10 MG/ML IJ SUSP
10.0000 mg | Freq: Once | INTRAMUSCULAR | Status: AC
  Administered 2017-01-20: 10 mg

## 2017-01-20 NOTE — Telephone Encounter (Signed)
Was just seen and Dr. Paulla Dolly drained fluid off of my 4th left toe. Stated he put something on toe under plastic thing he told me to wear whenever I wear my shoes. Don't know how long I am to wear this. Please give me a call back at [phone]. Thank you.

## 2017-01-23 NOTE — Progress Notes (Signed)
Subjective:    Patient ID: Annette Key, female   DOB: 76 y.o.   MRN: [id number]   HPI patient states she's developed a lot of pain between the fourth and fifth toes on her left foot and she feels like there is a lesion there    ROS      Objective:  Physical Exam neurovascular status intact muscle strength adequate range of motion within normal limits with patient found to have painful lesion fifth digit left foot and fourth digit left foot that's making it difficult to walk with. Patient states this is been ongoing and there is fluid buildup on the fourth toe     Assessment:   Inflammatory capsulitis interphalangeal joint digit 4 left with pain along with keratotic lesion formation      Plan:    H&P conditions reviewed and today I did a proximal nerve block of the fourth toe. After appropriate numbness and sterile preparation I went ahead and injected the interphalangeal joint 1 mg dexamethasone 1 mg Kenalog 5 mill grams Xylocaine and then debrided the lesion fully and applied padding. Reappoint when symptomatically and may ultimately require surgical intervention  X-ray indicate abnormal positioning between the fourth and fifth toes left

## 2017-03-08 ENCOUNTER — Ambulatory Visit (INDEPENDENT_AMBULATORY_CARE_PROVIDER_SITE_OTHER): Payer: PPO | Admitting: Podiatry

## 2017-03-08 ENCOUNTER — Encounter: Payer: Self-pay | Admitting: Podiatry

## 2017-03-08 DIAGNOSIS — M2042 Other hammer toe(s) (acquired), left foot: Secondary | ICD-10-CM | POA: Diagnosis not present

## 2017-03-08 NOTE — Progress Notes (Signed)
Subjective:    Patient ID: Annette Key, female   DOB: 76 y.o.   MRN: [id number]   HPI patient presents stating the fourth toe on my left foot is really bothering me and it only got better for a few weeks after the treatment you did    ROS      Objective:  Physical Exam neurovascular status intact with patient found to have painful keratotic lesion fourth digit left that's difficult for her to bear weight with an difficult to wear shoe gear with and she's not able to do the type of activity she wants     Assessment:    Neurovascular status intact with significant keratotic lesion lateral side fourth digit left that did not respond to conservative treatment done previously     Plan:    H&P condition reviewed and at this point due to failure to respond and patient's pain and frustration I have recommended surgical intervention in this case. Patient wants surgery and I allowed her to read consent form going over alternative treatments complications associated with this procedure and the fact there is no long-term guarantees the corner go away her to solve the problem. Patient stands total recovery can take upwards of 6 months and she is willing to accept risk and signs consent form after extensive review. Patient is scheduled for outpatient surgery and will call with any questions prior to procedure

## 2017-03-08 NOTE — Patient Instructions (Signed)
Pre-Operative Instructions  Congratulations, you have decided to take an important step towards improving your quality of life.  You can be assured that the doctors and staff at [facility] will be with you every step of the way.  Here are some important things you should know:  1. Plan to be at the surgery center/hospital at least 1 (one) hour prior to your scheduled time, unless otherwise directed by the surgical center/hospital staff.  You must have a responsible adult accompany you, remain during the surgery and drive you home.  Make sure you have directions to the surgical center/hospital to ensure you arrive on time. 2. If you are having surgery at [facility] or  hospitals, you will need a copy of your medical history and physical form from your family physician within one month prior to the date of surgery. We will give you a form for your primary physician to complete.  3. We make every effort to accommodate the date you request for surgery.  However, there are times where surgery dates or times have to be moved.  We will contact you as soon as possible if a change in schedule is required.   4. No aspirin/ibuprofen for one week before surgery.  If you are on aspirin, any non-steroidal anti-inflammatory medications (Mobic, Aleve, Ibuprofen) should not be taken seven (7) days prior to your surgery.  You make take Tylenol for pain prior to surgery.  5. Medications - If you are taking daily heart and blood pressure medications, seizure, reflux, allergy, asthma, anxiety, pain or diabetes medications, make sure you notify the surgery center/hospital before the day of surgery so they can tell you which medications you should take or avoid the day of surgery. 6. No food or drink after midnight the night before surgery unless directed otherwise by surgical center/hospital staff. 7. No alcoholic beverages 24-hours prior to surgery.  No smoking 24-hours prior or 24-hours after  surgery. 8. Wear loose pants or shorts. They should be loose enough to fit over bandages, boots, and casts. 9. Don't wear slip-on shoes. Sneakers are preferred. 10. Bring your boot with you to the surgery center/hospital.  Also bring crutches or a walker if your physician has prescribed it for you.  If you do not have this equipment, it will be provided for you after surgery. 11. If you have not been contacted by the surgery center/hospital by the day before your surgery, call to confirm the date and time of your surgery. 12. Leave-time from work may vary depending on the type of surgery you have.  Appropriate arrangements should be made prior to surgery with your employer. 13. Prescriptions will be provided immediately following surgery by your doctor.  Fill these as soon as possible after surgery and take the medication as directed. Pain medications will not be refilled on weekends and must be approved by the doctor. 14. Remove nail polish on the operative foot and avoid getting pedicures prior to surgery. 15. Wash the night before surgery.  The night before surgery wash the foot and leg well with water and the antibacterial soap provided. Be sure to pay special attention to beneath the toenails and in between the toes.  Wash for at least three (3) minutes. Rinse thoroughly with water and dry well with a towel.  Perform this wash unless told not to do so by your physician.  Enclosed: 1 Ice pack (please put in freezer the night before surgery)   1 Hibiclens skin cleaner     Pre-op instructions  If you have any questions regarding the instructions, please do not hesitate to call our office.  [place]: [address], [place], [place] [address] -- [phone]  [place]: [address] [place], [place] [address] -- [phone]  [place]: [address]  [place], [place] [address] -- [phone]  [place]: [address], [address], [place], [place] [address] -- [phone]  Website:  [email or web address] 

## 2017-03-21 ENCOUNTER — Telehealth: Payer: Self-pay | Admitting: *Deleted

## 2017-03-21 NOTE — Telephone Encounter (Signed)
"  I want to schedule surgery for the Hammer Toe.  I'm thinking of September 18 which is a Tuesday."

## 2017-03-23 NOTE — Telephone Encounter (Signed)
Pt called again and said she would prefer to have surgery on 9.25.18. I told pt I would pass that information on and you would call her as soon as you can.

## 2017-03-24 DIAGNOSIS — I10 Essential (primary) hypertension: Secondary | ICD-10-CM | POA: Diagnosis not present

## 2017-03-24 NOTE — Telephone Encounter (Signed)
I attempted to return patient's call.  I left her messages to call me back.

## 2017-03-24 NOTE — Telephone Encounter (Signed)
I left patient a message that I would schedule her appointment for September 25.  I informed her that someone from the surgical center would call her with the arrival time the Friday or Monday prior to surgery with the arrival time.  I asked her to call if she had further questions.

## 2017-03-27 ENCOUNTER — Telehealth: Payer: Self-pay | Admitting: *Deleted

## 2017-03-27 NOTE — Telephone Encounter (Addendum)
"  I just want to say I would actually prefer the 25th of September instead of the week earlier.  I don't know how that works out with you."  "I hope we can work out a time to get the hammer toe done.  Please give me a call, thank youl

## 2017-03-27 NOTE — Telephone Encounter (Signed)
I left patient a message on Friday, 03/24/2017, that I would schedule her surgery for September 25.

## 2017-03-28 ENCOUNTER — Telehealth: Payer: Self-pay | Admitting: *Deleted

## 2017-03-28 NOTE — Telephone Encounter (Signed)
"  My toe hasn't been hurting for the last week.  I'm going to see if I can avoid the surgery.  I am going to give it a chance here.  I'm going to cancel the 24th."

## 2017-03-31 NOTE — Telephone Encounter (Signed)
I called and left her a message that I would cancel her surgery scheduled for September 25.  I asked her to call us if she has any questions or concerns.

## 2017-04-03 ENCOUNTER — Telehealth: Payer: Self-pay | Admitting: *Deleted

## 2017-04-03 NOTE — Telephone Encounter (Signed)
"  I'm calling about the CPT code you submitted for this patient for authorization.  The code that is written is an invalid code.  Do you have another CPT code we can use?"  You can cancel the request, the patient called and canceled the surgery.  "Okay thank you, I'll make note of it."

## 2017-04-07 ENCOUNTER — Telehealth: Payer: Self-pay | Admitting: *Deleted

## 2017-04-07 NOTE — Telephone Encounter (Addendum)
"  I have decided to go ahead and have my surgery.  Is there anyway you can reschedule me for Tuesday of next week?"  No, I cannot.  You have [employer] and it requires authorization.  I had stopped the process before because you canceled your surgery.  "Can he do it next week?"  No, he does not have anything available that week.  His next available would be October 30.  "Okay, schedule me for that date."

## 2017-04-17 ENCOUNTER — Ambulatory Visit: Payer: PPO

## 2017-04-26 ENCOUNTER — Telehealth: Payer: Self-pay | Admitting: *Deleted

## 2017-04-26 DIAGNOSIS — Z0000 Encounter for general adult medical examination without abnormal findings: Secondary | ICD-10-CM | POA: Diagnosis not present

## 2017-04-26 NOTE — Telephone Encounter (Signed)
"  I'm calling to cancel my surgery for October 30.  I been wearing my orthotics and my toe is doing better."  I will let Dr. Paulla Dolly know and cancel the surgery at the surgical center.    I called and spoke to [facility] at the surgical center.  I canceled the surgery.

## 2017-04-27 ENCOUNTER — Ambulatory Visit: Payer: PPO | Admitting: Podiatry

## 2017-06-30 DIAGNOSIS — I10 Essential (primary) hypertension: Secondary | ICD-10-CM | POA: Diagnosis not present

## 2017-06-30 DIAGNOSIS — Z6822 Body mass index (BMI) 22.0-22.9, adult: Secondary | ICD-10-CM | POA: Diagnosis not present

## 2017-06-30 DIAGNOSIS — R5383 Other fatigue: Secondary | ICD-10-CM | POA: Diagnosis not present

## 2017-10-12 DIAGNOSIS — J111 Influenza due to unidentified influenza virus with other respiratory manifestations: Secondary | ICD-10-CM | POA: Diagnosis not present

## 2017-10-12 DIAGNOSIS — R05 Cough: Secondary | ICD-10-CM | POA: Diagnosis not present

## 2017-10-12 DIAGNOSIS — J019 Acute sinusitis, unspecified: Secondary | ICD-10-CM | POA: Diagnosis not present

## 2017-10-12 DIAGNOSIS — R5383 Other fatigue: Secondary | ICD-10-CM | POA: Diagnosis not present

## 2017-10-12 DIAGNOSIS — Z6825 Body mass index (BMI) 25.0-25.9, adult: Secondary | ICD-10-CM | POA: Diagnosis not present

## 2017-10-27 DIAGNOSIS — L570 Actinic keratosis: Secondary | ICD-10-CM | POA: Diagnosis not present

## 2017-10-27 DIAGNOSIS — L821 Other seborrheic keratosis: Secondary | ICD-10-CM | POA: Diagnosis not present

## 2017-10-27 DIAGNOSIS — D225 Melanocytic nevi of trunk: Secondary | ICD-10-CM | POA: Diagnosis not present

## 2017-10-27 DIAGNOSIS — L738 Other specified follicular disorders: Secondary | ICD-10-CM | POA: Diagnosis not present

## 2017-11-01 DIAGNOSIS — H16223 Keratoconjunctivitis sicca, not specified as Sjogren's, bilateral: Secondary | ICD-10-CM | POA: Diagnosis not present

## 2017-11-01 DIAGNOSIS — H35371 Puckering of macula, right eye: Secondary | ICD-10-CM | POA: Diagnosis not present

## 2017-11-01 DIAGNOSIS — Z961 Presence of intraocular lens: Secondary | ICD-10-CM | POA: Diagnosis not present

## 2017-11-01 DIAGNOSIS — H43813 Vitreous degeneration, bilateral: Secondary | ICD-10-CM | POA: Diagnosis not present

## 2017-11-24 DIAGNOSIS — M859 Disorder of bone density and structure, unspecified: Secondary | ICD-10-CM | POA: Diagnosis not present

## 2017-11-24 DIAGNOSIS — I10 Essential (primary) hypertension: Secondary | ICD-10-CM | POA: Diagnosis not present

## 2017-11-24 DIAGNOSIS — R82998 Other abnormal findings in urine: Secondary | ICD-10-CM | POA: Diagnosis not present

## 2017-11-28 DIAGNOSIS — J3089 Other allergic rhinitis: Secondary | ICD-10-CM | POA: Diagnosis not present

## 2017-11-28 DIAGNOSIS — M1990 Unspecified osteoarthritis, unspecified site: Secondary | ICD-10-CM | POA: Diagnosis not present

## 2017-11-28 DIAGNOSIS — I10 Essential (primary) hypertension: Secondary | ICD-10-CM | POA: Diagnosis not present

## 2017-11-28 DIAGNOSIS — Z0000 Encounter for general adult medical examination without abnormal findings: Secondary | ICD-10-CM | POA: Diagnosis not present

## 2017-11-28 DIAGNOSIS — M859 Disorder of bone density and structure, unspecified: Secondary | ICD-10-CM | POA: Diagnosis not present

## 2017-11-28 DIAGNOSIS — Z6824 Body mass index (BMI) 24.0-24.9, adult: Secondary | ICD-10-CM | POA: Diagnosis not present

## 2017-11-28 DIAGNOSIS — L409 Psoriasis, unspecified: Secondary | ICD-10-CM | POA: Diagnosis not present

## 2018-03-30 DIAGNOSIS — H16223 Keratoconjunctivitis sicca, not specified as Sjogren's, bilateral: Secondary | ICD-10-CM | POA: Diagnosis not present

## 2018-05-09 DIAGNOSIS — Z23 Encounter for immunization: Secondary | ICD-10-CM | POA: Diagnosis not present

## 2018-05-24 DIAGNOSIS — Z6825 Body mass index (BMI) 25.0-25.9, adult: Secondary | ICD-10-CM | POA: Diagnosis not present

## 2018-05-24 DIAGNOSIS — J40 Bronchitis, not specified as acute or chronic: Secondary | ICD-10-CM | POA: Diagnosis not present

## 2018-05-24 DIAGNOSIS — R05 Cough: Secondary | ICD-10-CM | POA: Diagnosis not present

## 2018-06-21 DIAGNOSIS — R05 Cough: Secondary | ICD-10-CM | POA: Diagnosis not present

## 2018-07-06 ENCOUNTER — Telehealth: Payer: Self-pay | Admitting: Pulmonary Disease

## 2018-07-06 ENCOUNTER — Ambulatory Visit: Payer: PPO | Admitting: Pulmonary Disease

## 2018-07-06 ENCOUNTER — Encounter: Payer: Self-pay | Admitting: Pulmonary Disease

## 2018-07-06 VITALS — BP 124/78 | HR 70 | Ht 64.25 in | Wt 150.8 lb

## 2018-07-06 DIAGNOSIS — R0602 Shortness of breath: Secondary | ICD-10-CM | POA: Diagnosis not present

## 2018-07-06 LAB — NITRIC OXIDE: Nitric Oxide: 37

## 2018-07-06 MED ORDER — MONTELUKAST SODIUM 10 MG PO TABS: 10.0000 mg | ORAL_TABLET | Freq: Every day | ORAL | 5 refills | Status: DC

## 2018-07-06 NOTE — Patient Instructions (Signed)
Singulair (montelukast) 10 mg nightly Lab tests today Will get copy of xray reports from Dr. Keane Police office  Follow up in 2 weeks with Dr. Halford Chessman or Nurse Practitioner

## 2018-07-06 NOTE — Progress Notes (Signed)
[facility]  Chief Complaint  Patient presents with  . pulmonary consult    per Dr. Virgina Jock- pt states she developed URI 04/2018, since she has has sob with exertion and with rest & chest tightness.     Constitutional:  BP 124/78 (BP Location: Left Arm, Cuff Size: Normal)   Pulse 70   Ht 5' 4.25" (1.632 m)   Wt 150 lb 12.8 oz (68.4 kg)   SpO2 99%   BMI 25.68 kg/m   Past Medical History:  Allergies  Brief Summary:  Annette Key is a 77 y.o. female with shortness of breath.  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  In October of this year several members had a cough and she believes she got a respiratory infection from there.  She was having cough, sore throat and feeling short of breath.  Her breathing issues have persisted.  She has been on several courses of antibiotics and a course of prednisone.  She was also tried on advair.  None of these helped, and actually made her feel worse.  She feels that advair made her feel jittery.  She usually walks several miles a day.  Over the past few weeks when she goes for a walk she feels totally wiped out afterward.  She had several episodes of bronchitis over the past couple of years.  She would get similar symptoms, but these previous episodes never lasted this long.    She has not been told she had asthma before.  She does have seasonal allergies.  She denies history of pneumonia or exposure to TB.  Not history of smoking.  She lived in [place] and then [place] before moving to [place].  FeNO today was elevated.  Spirometry normal.  She maintained SpO2 > 92% on ambulatory oximetry today.   Physical Exam:   Appearance - well kempt   ENMT - clear nasal mucosa, midline nasal  septum, no oral exudates, no LAN, trachea midline  Respiratory - normal chest wall, normal respiratory effort, no accessory muscle use, no wheeze/rales  CV - s1s2 regular rate and rhythm, no murmurs, no  peripheral edema, radial pulses symmetric  GI - soft, non tender, no masses  Lymph - no adenopathy noted in neck and axillary areas  MSK - normal gait  Ext - no cyanosis, clubbing, or joint inflammation noted  Skin - no rashes, lesions, or ulcers  Neuro - normal strength, oriented x 3  Psych - normal mood and affect  Discussion:  She has recurrent episodes of bronchitis.  She likely had a viral upper respiratory infection in October 2019.  This likely triggered an asthma like reaction.  Her FeNO is elevated which is suggestive of ongoing airway inflammation.  She had trouble tolerating advair and is reluctant to try steroid inhalers again at this time.  Assessment/Plan:   Asthma after recent upper respiratory infection. - don't think she has active infection at this time - she is reluctant to try inhaled steroids at this time - will check CMET and CBC with differential - will get copy of chest imaging studies from her PCP's office - will try her on singulair - if her symptoms persist at next follow up, then will need to reconsider inhaled steroids   Patient Instructions  Singulair (montelukast) 10 mg nightly Lab tests today Will get copy of xray reports from Dr. Keane Police office  Follow up in 2 weeks with Dr. Halford Chessman or Nurse  Practitioner    Chesley Mires, MD [place] Pager: [phone] 07/06/2018, 5:01 PM  Flow Sheet     Pulmonary tests:  FeNO 07/06/18 >> 37 Spirometry 07/06/18 >> FEV1 2.0 (100%), FEV1% 90  Review of Systems:  Constitutional: Negative for fever and unexpected weight change.  HENT: Negative for congestion, dental problem, ear pain, nosebleeds, postnasal drip, rhinorrhea, sinus pressure, sneezing, sore throat and trouble swallowing.   Eyes: Negative for redness and itching.  Respiratory: Positive for chest tightness and shortness of breath. Negative for cough and wheezing.   Cardiovascular: Negative for palpitations and leg  swelling.  Gastrointestinal: Negative for nausea and vomiting.  Genitourinary: Negative for dysuria.  Musculoskeletal: Negative for joint swelling.  Skin: Negative for rash.  Neurological: Negative for headaches.  Hematological: Does not bruise/bleed easily.  Psychiatric/Behavioral: Negative for dysphoric mood. The patient is not nervous/anxious.    Medications:   Allergies as of 07/06/2018      Reactions   Sulfa Antibiotics Palpitations      Medication List       Accurate as of July 06, 2018  5:01 PM. Always use your most recent med list.        montelukast 10 MG tablet Commonly known as:  SINGULAIR Take 1 tablet (10 mg total) by mouth at bedtime.   multivitamin tablet Take 1 tablet by mouth daily.   Vitamin D-3 125 MCG (5000 UT) Tabs Take 1 tablet by mouth daily.       Past Surgical History:  She has not had any prior surgeries.  Family History:  Her family history includes Cancer in her father; Hypertension in her mother; Throat cancer in her father.  Social History:  She  reports that she has never smoked. She has never used smokeless tobacco. She reports that she does not drink alcohol or use drugs.

## 2018-07-06 NOTE — Progress Notes (Signed)
   Subjective:    Patient ID: Annette Key, female    DOB: Feb 18, 1941, 77 y.o.   MRN: [id number]  HPI    Review of Systems  Constitutional: Negative for fever and unexpected weight change.  HENT: Negative for congestion, dental problem, ear pain, nosebleeds, postnasal drip, rhinorrhea, sinus pressure, sneezing, sore throat and trouble swallowing.   Eyes: Negative for redness and itching.  Respiratory: Positive for chest tightness and shortness of breath. Negative for cough and wheezing.   Cardiovascular: Negative for palpitations and leg swelling.  Gastrointestinal: Negative for nausea and vomiting.  Genitourinary: Negative for dysuria.  Musculoskeletal: Negative for joint swelling.  Skin: Negative for rash.  Neurological: Negative for headaches.  Hematological: Does not bruise/bleed easily.  Psychiatric/Behavioral: Negative for dysphoric mood. The patient is not nervous/anxious.        Objective:   Physical Exam        Assessment & Plan:

## 2018-07-06 NOTE — Telephone Encounter (Addendum)
Contacted Dr. Keane Police office at [phone] and left detailed message with medical records, requesting PNA vaccine dates and records (last ov, recent imaging and labs). Will route to both myself and [facility] for f/u

## 2018-07-07 LAB — CBC WITH DIFFERENTIAL/PLATELET
ABSOLUTE MONOCYTES: 673 {cells}/uL (ref 200–950)
BASOS PCT: 0.5 %
Basophils Absolute: 33 cells/uL (ref 0–200)
EOS ABS: 79 {cells}/uL (ref 15–500)
Eosinophils Relative: 1.2 %
HCT: 36.9 % (ref 35.0–45.0)
HEMOGLOBIN: 12.3 g/dL (ref 11.7–15.5)
Lymphs Abs: 2581 cells/uL (ref 850–3900)
MCH: 30.4 pg (ref 27.0–33.0)
MCHC: 33.3 g/dL (ref 32.0–36.0)
MCV: 91.3 fL (ref 80.0–100.0)
MPV: 11.3 fL (ref 7.5–12.5)
Monocytes Relative: 10.2 %
NEUTROS ABS: 3234 {cells}/uL (ref 1500–7800)
Neutrophils Relative %: 49 %
PLATELETS: 231 10*3/uL (ref 140–400)
RBC: 4.04 10*6/uL (ref 3.80–5.10)
RDW: 12.4 % (ref 11.0–15.0)
TOTAL LYMPHOCYTE: 39.1 %
WBC: 6.6 10*3/uL (ref 3.8–10.8)

## 2018-07-07 LAB — COMPREHENSIVE METABOLIC PANEL
AG Ratio: 1.6 (calc) (ref 1.0–2.5)
ALBUMIN MSPROF: 3.9 g/dL (ref 3.6–5.1)
ALKALINE PHOSPHATASE (APISO): 85 U/L (ref 33–130)
ALT: 13 U/L (ref 6–29)
AST: 17 U/L (ref 10–35)
BILIRUBIN TOTAL: 0.3 mg/dL (ref 0.2–1.2)
BUN/Creatinine Ratio: 21 (calc) (ref 6–22)
BUN: 20 mg/dL (ref 7–25)
CALCIUM: 9.1 mg/dL (ref 8.6–10.4)
CO2: 26 mmol/L (ref 20–32)
Chloride: 106 mmol/L (ref 98–110)
Creat: 0.94 mg/dL — ABNORMAL HIGH (ref 0.60–0.93)
Globulin: 2.5 g/dL (calc) (ref 1.9–3.7)
Glucose, Bld: 118 mg/dL — ABNORMAL HIGH (ref 65–99)
POTASSIUM: 4.6 mmol/L (ref 3.5–5.3)
Sodium: 139 mmol/L (ref 135–146)
Total Protein: 6.4 g/dL (ref 6.1–8.1)

## 2018-07-09 ENCOUNTER — Telehealth: Payer: Self-pay | Admitting: Pulmonary Disease

## 2018-07-09 NOTE — Telephone Encounter (Signed)
Left another message for Dr.Russo's office

## 2018-07-09 NOTE — Telephone Encounter (Signed)
Spoke with patient. She is aware of results, verbalized understanding.   Nothing further needed at time of call.  

## 2018-07-09 NOTE — Telephone Encounter (Signed)
Records have been received and placed and given to [facility].  immunizations have been updated within pt's chart.  Nothing further is needed.

## 2018-07-09 NOTE — Telephone Encounter (Signed)
CMP Latest Ref Rng & Units 07/06/2018 11/18/2012  Glucose 65 - 99 mg/dL 118(H) 104(H)  BUN 7 - 25 mg/dL 20 17  Creatinine 0.60 - 0.93 mg/dL 0.94(H) 0.72  Sodium 135 - 146 mmol/L 139 137  Potassium 3.5 - 5.3 mmol/L 4.6 4.7  Chloride 98 - 110 mmol/L 106 103  CO2 20 - 32 mmol/L 26 27  Calcium 8.6 - 10.4 mg/dL 9.1 9.4  Total Protein 6.1 - 8.1 g/dL 6.4 -  Total Bilirubin 0.2 - 1.2 mg/dL 0.3 -  AST 10 - 35 U/L 17 -  ALT 6 - 29 U/L 13 -   CBC    Component Value Date/Time   WBC 6.6 07/06/2018 1626   RBC 4.04 07/06/2018 1626   HGB 12.3 07/06/2018 1626   HCT 36.9 07/06/2018 1626   PLT 231 07/06/2018 1626   MCV 91.3 07/06/2018 1626   MCH 30.4 07/06/2018 1626   MCHC 33.3 07/06/2018 1626   RDW 12.4 07/06/2018 1626   LYMPHSABS 2,581 07/06/2018 1626   MONOABS 0.5 11/18/2012 1127   EOSABS 79 07/06/2018 1626   BASOSABS 33 07/06/2018 1626    Please let her know her labs were normal.

## 2018-07-19 ENCOUNTER — Ambulatory Visit: Payer: PPO | Admitting: Primary Care

## 2018-07-21 ENCOUNTER — Telehealth: Payer: Self-pay | Admitting: Pulmonary Disease

## 2018-07-21 NOTE — Telephone Encounter (Signed)
PCCM:  Received a phone call from the physician answering service.  Patient of Dr. Halford Chessman.  Recently established care at the beginning of December.  She had scheduled follow-up in our office with one of our nurse practitioners.  Patient stated she canceled her appointment because she did not want to see a nurse practitioner.  She is calling today because she "feels funny".  She feels like she is not getting any better.  She did feel better when she was taking the Singulair as prescribed by Dr. Halford Chessman on the last visit.  She stopped taking the Singulair because she was feeling better.  She noticed tingling in her hands and thought it might be related to that and she stopped the medicine.  [place] currently [place] at [facility] in [place].  I feel as if she could restart her Singulair and she should follow-up in clinic as directed.  Please schedule an appointment with Dr. Halford Chessman on his next available f/u.  Or you can offer the patient a follow-up appointment with one of our nurse practitioners.  [facility] 07/21/2018 1:09 PM

## 2018-07-23 ENCOUNTER — Encounter: Payer: Self-pay | Admitting: Pulmonary Disease

## 2018-07-23 ENCOUNTER — Ambulatory Visit (INDEPENDENT_AMBULATORY_CARE_PROVIDER_SITE_OTHER): Payer: PPO | Admitting: Pulmonary Disease

## 2018-07-23 ENCOUNTER — Institutional Professional Consult (permissible substitution): Payer: PPO | Admitting: Pulmonary Disease

## 2018-07-23 VITALS — BP 122/70 | HR 69 | Ht 64.25 in | Wt 149.0 lb

## 2018-07-23 DIAGNOSIS — R0602 Shortness of breath: Secondary | ICD-10-CM | POA: Diagnosis not present

## 2018-07-23 DIAGNOSIS — R002 Palpitations: Secondary | ICD-10-CM

## 2018-07-23 NOTE — Progress Notes (Signed)
[facility]  Chief Complaint  Patient presents with  . Follow-up    pt states he took signulair for one week and stopped due to tingling in her hands. pt resumed signulair saturday. pt reports of mild sob with exertion &  fluttering in chest.    Constitutional:  BP 122/70 (BP Location: Left Arm, Cuff Size: Normal)   Pulse 69   Ht 5' 4.25" (1.632 m)   Wt 149 lb (67.6 kg)   SpO2 99%   BMI 25.38 kg/m   Past Medical History:  Allergies  Brief Summary:  Annette Key is a 78 y.o. female with shortness of breath.  She felt better with singulair.  She developed paresthesia in her hand and was worried that this was from singulair so she stopped the medicine.  Her breathing got worse again.  She started singulair back two days ago.  Her family was worried that she could have reflux and wanted her to try pepcid.  She still gets feeling of palpitations in her chest.  She thinks this was better when she was using singulair.  She is not having cough, wheeze, or sputum.  She feels fatigued at times, and gets sweats at times also.   Physical Exam:   Appearance - well kempt   ENMT - no sinus tenderness, no nasal discharge, no oral exudate  Neck - no masses, trachea midline, no thyromegaly, no elevation in JVP  Respiratory - normal appearance of chest wall, normal respiratory effort w/o accessory muscle use, no wheezing or rales  CV - s1s2 regular rate and rhythm, no murmurs, no peripheral edema, radial pulses symmetric  GI - soft, non tender, no masses  Lymph - no adenopathy noted in neck and axillary areas  MSK - normal gait  Ext - no cyanosis, clubbing, or joint inflammation noted  Skin - no rashes, lesions, or ulcers  Neuro - normal strength, oriented x 3  Psych - normal mood and affect  ECG today showed normal sinus rhythm with heart rate 76  Discussion:  She has recurrent episodes of bronchitis.  She likely had a viral upper  respiratory infection in October 2019.  This likely triggered an asthma like reaction.  Her FeNO is elevated which is suggestive of ongoing airway inflammation.  She had trouble tolerating advair and is reluctant to try steroid inhalers again at this time.  Assessment/Plan:   Asthma after recent upper respiratory infection. - advised her to resume singulair - she is reluctant to try steroid medications  Palpitations. - ECG normal today - advised her to follow up with her PCP if this persists  Intermittently reflux. - it seems less likely that she is having reflux as the cause of her symptoms - if she doesn't feel improvement after resuming singulair for a couple of weeks, then advise she could try OTC pepcid    Patient Instructions  Resume using singulair 10 mg pill at night  Follow up in 8 weeks    Chesley Mires, MD [place] Pager: [phone] 07/23/2018, 3:21 PM  Flow Sheet     Pulmonary tests:  FeNO 07/06/18 >> 37 Spirometry 07/06/18 >> FEV1 2.0 (100%), FEV1% 90    Medications:   Allergies as of 07/23/2018      Reactions   Sulfa Antibiotics Palpitations      Medication List       Accurate as of July 23, 2018  3:21 PM. Always use your most recent med list.  montelukast 10 MG tablet Commonly known as:  SINGULAIR Take 1 tablet (10 mg total) by mouth at bedtime.   multivitamin tablet Take 1 tablet by mouth daily.   Vitamin D-3 125 MCG (5000 UT) Tabs Take 1 tablet by mouth daily.       Past Surgical History:  She has not had any prior surgeries.  Family History:  Her family history includes Cancer in her father; Hypertension in her mother; Throat cancer in her father.  Social History:  She  reports that she has never smoked. She has never used smokeless tobacco. She reports that she does not drink alcohol or use drugs.

## 2018-07-23 NOTE — Patient Instructions (Signed)
Resume using singulair 10 mg pill at night  Follow up in 8 weeks

## 2018-07-23 NOTE — Telephone Encounter (Signed)
Pt has already called office, scheduled to see VS this afternoon.  Nothing further needed at this time.

## 2018-07-25 ENCOUNTER — Ambulatory Visit: Payer: PPO | Admitting: Pulmonary Disease

## 2018-07-25 ENCOUNTER — Telehealth: Payer: Self-pay | Admitting: Pulmonary Disease

## 2018-07-25 MED ORDER — PREDNISONE 10 MG PO TABS: ORAL_TABLET | ORAL | 0 refills | Status: DC

## 2018-07-25 MED ORDER — AZITHROMYCIN 250 MG PO TABS: ORAL_TABLET | ORAL | 0 refills | Status: DC

## 2018-07-25 NOTE — Telephone Encounter (Signed)
She can try taking ibuprofen. If not better, then she would need to come in for a chest xray.

## 2018-07-25 NOTE — Telephone Encounter (Signed)
Called and spoke with pt letting her know that VS said to try ibuprofen to see if that would help and pt stated that she believes ibuprofen will not touch the pain that she is having.  Stated to pt that VS said if no better to come in for a cxr and pt stated to me she could come in but stated to me she recently had two cxr performed by Dr. Keane Police office.  Pt stated to me she is still perspiring a lot and states that she feels really bad with her symptoms. Pt is afraid that the infection is still there and is afraid that this might be pna or something else based on how she feels.  Pt again stated that she really needs to speak to VS in regards to how she feels. Pt stated she feels like this is an urgency.  Pt was made aware that VS is currently in clinic and I stated to her that we would send this back over to him for him to review.  Dr. Halford Chessman, please advise. Thanks!

## 2018-07-25 NOTE — Telephone Encounter (Signed)
Patient called back in regards of this message; pt contact # [phone]

## 2018-07-25 NOTE — Telephone Encounter (Signed)
Spoke with the pt  She states when she was here for ov 07/23/18 she did not describe what was going on with her accurately  She states rather than a "flutter" in her chest, she is having "dull pain" She states the pain has been constant since ov  She states it is not new  She has applied a heating pad and states it helped some  She then reports had sweats which I advised could have came from applying the heat but she disagreed with this  She wants to know if she can take ibuprofen  She is also asking what VS thinks is causing this discomfort- "infection or something else" She states does not want to come in for another appt at this time  Please advise thanks

## 2018-07-25 NOTE — Telephone Encounter (Signed)
Called and spoke with pt letting her know that we were still awaiting a response from VS. Pt expressed understanding and stated she would like for VS to call her directly to discuss this with him.  Pt can be reached at [phone]. Routing back to VS.

## 2018-07-25 NOTE — Telephone Encounter (Signed)
Spoke with pt.  Discussed her symptoms.  Will give course of zithromax and prednisone.  If she doesn't improve with this therapy, would then need further lab assessment and chest imaging with CT chest.

## 2018-07-26 ENCOUNTER — Telehealth: Payer: Self-pay | Admitting: Pulmonary Disease

## 2018-07-26 NOTE — Telephone Encounter (Signed)
Pt is aware of below recommendations and voiced her understanding. Nothing further is needed.  

## 2018-07-26 NOTE — Telephone Encounter (Signed)
Error

## 2018-07-26 NOTE — Telephone Encounter (Signed)
That is fine , take with food and push fluids  Keep [place] updated as needed  Please contact office for sooner follow up if symptoms do not improve or worsen or seek emergency care

## 2018-07-26 NOTE — Telephone Encounter (Signed)
VS pt- lasts seen 07/23/18:  Called and spoke to pt.  Pt was prescribed zpak yesterday per 07/25/17 phone note. Pt states that she has taken zpak previously and it made her "feel bad". Pt could not recall symptoms, due to it being 7  years ago.  Pt states that she only took one tablet of azithromycin this morning but plans to take the another tablet in a few minutes, as this is day one.  Pt states as of now she has not developed any symptoms. Pt stated that she is willing to take zpak and will contact our office if she developed any symptoms. Azithromycin is not listed as an allergy within pt's chart.  Sending to TP as an FYI, as VS is unavailable.

## 2018-07-30 ENCOUNTER — Telehealth: Payer: Self-pay | Admitting: Pulmonary Disease

## 2018-07-30 NOTE — Telephone Encounter (Signed)
Called and spoke to pt.  Pt states she took last dose of prednisone and zpak today. Pt states she is feeling perspiring . Pt stated that she is developing chest discomfort with exertion, sweats & chest tightness. Pt feels that these sx are related to coming off of prednisone and zpak. Denied cough, wheezing, increased sob, fever or chills.  I have offered apt with NP, pt declined.  Pt has been scheduled for acute visit with Dr.Olalere  Advised pt to go to ED if sx worsen over night. Nothing further is needed.

## 2018-07-31 ENCOUNTER — Encounter: Payer: Self-pay | Admitting: Pulmonary Disease

## 2018-07-31 ENCOUNTER — Ambulatory Visit (INDEPENDENT_AMBULATORY_CARE_PROVIDER_SITE_OTHER): Payer: PPO | Admitting: Pulmonary Disease

## 2018-07-31 ENCOUNTER — Ambulatory Visit (INDEPENDENT_AMBULATORY_CARE_PROVIDER_SITE_OTHER)
Admission: RE | Admit: 2018-07-31 | Discharge: 2018-07-31 | Disposition: A | Discharge status: Home / Self Care | Payer: PPO | Source: Ambulatory Visit | Attending: Pulmonary Disease | Admitting: Pulmonary Disease

## 2018-07-31 VITALS — BP 110/62 | HR 81 | Temp 97.4°F | Ht 64.0 in | Wt 149.0 lb

## 2018-07-31 DIAGNOSIS — R079 Chest pain, unspecified: Secondary | ICD-10-CM | POA: Diagnosis not present

## 2018-07-31 DIAGNOSIS — R5382 Chronic fatigue, unspecified: Secondary | ICD-10-CM

## 2018-07-31 DIAGNOSIS — R0602 Shortness of breath: Secondary | ICD-10-CM

## 2018-07-31 LAB — CBC WITH DIFFERENTIAL/PLATELET
BASOS PCT: 0.6 % (ref 0.0–3.0)
Basophils Absolute: 0.1 10*3/uL (ref 0.0–0.1)
EOS ABS: 0 10*3/uL (ref 0.0–0.7)
Eosinophils Relative: 0.2 % (ref 0.0–5.0)
HEMATOCRIT: 39.9 % (ref 36.0–46.0)
HEMOGLOBIN: 13.5 g/dL (ref 12.0–15.0)
LYMPHS PCT: 28.1 % (ref 12.0–46.0)
Lymphs Abs: 2.6 10*3/uL (ref 0.7–4.0)
MCHC: 33.8 g/dL (ref 30.0–36.0)
MCV: 89.7 fl (ref 78.0–100.0)
MONO ABS: 0.8 10*3/uL (ref 0.1–1.0)
Monocytes Relative: 9.3 % (ref 3.0–12.0)
NEUTROS ABS: 5.6 10*3/uL (ref 1.4–7.7)
Neutrophils Relative %: 61.8 % (ref 43.0–77.0)
PLATELETS: 274 10*3/uL (ref 150.0–400.0)
RBC: 4.45 Mil/uL (ref 3.87–5.11)
RDW: 13.8 % (ref 11.5–15.5)
WBC: 9.1 10*3/uL (ref 4.0–10.5)

## 2018-07-31 LAB — BASIC METABOLIC PANEL
BUN: 13 mg/dL (ref 6–23)
CO2: 26 mEq/L (ref 19–32)
Calcium: 9.3 mg/dL (ref 8.4–10.5)
Chloride: 101 mEq/L (ref 96–112)
Creatinine, Ser: 0.77 mg/dL (ref 0.40–1.20)
GFR: 77.01 mL/min (ref >60.00)
Glucose, Bld: 107 mg/dL — ABNORMAL HIGH (ref 70–99)
Potassium: 4.3 mEq/L (ref 3.5–5.1)
Sodium: 135 mEq/L (ref 135–145)

## 2018-07-31 LAB — SEDIMENTATION RATE: Sed Rate: 18 mm/hr (ref 0–30)

## 2018-07-31 NOTE — Progress Notes (Signed)
Annette Key    [id number]    1941-01-02  Primary Care Physician:Russo, Jenny Reichmann, MD  Referring Physician: Shon Baton, MD [address] [place],  [address]  Chief complaint:   Patient came in for an acute visit Follows up with Dr. Halford Chessman  HPI:  Recently completed a course of azithromycin and prednisone Chest discomfort sweats chest tightness  Has had symptoms since about November Treated with a course of amoxicillin and steroids  Recently treated with a course of azithromycin and steroids  Concerned about nonresolution of symptoms  Denies any fevers, has not been coughing, has not had any chills  Concerned about mold-has not been doing any remodeling, no obvious contact with mold  No background history of lung disease  Outpatient Encounter Medications as of 07/31/2018  Medication Sig  . Cholecalciferol (VITAMIN D-3) 5000 UNITS TABS Take 1 tablet by mouth daily.  . Multiple Vitamin (MULTIVITAMIN) tablet Take 1 tablet by mouth daily.  . [DISCONTINUED] azithromycin (ZITHROMAX) 250 MG tablet 2 pills on day 1, then 1 pill daily  . [DISCONTINUED] montelukast (SINGULAIR) 10 MG tablet Take 1 tablet (10 mg total) by mouth at bedtime.  . [DISCONTINUED] predniSONE (DELTASONE) 10 MG tablet 2 pills daily for 2 days, 1 pill daily for 2 days, 1/2 pill daily for 2 days   No facility-administered encounter medications on file as of 07/31/2018.     Allergies as of 07/31/2018 - Review Complete 07/31/2018  Allergen Reaction Noted  . Sulfa antibiotics Palpitations 11/18/2012    Past Medical History:  Diagnosis Date  . Allergic rhinitis     No past surgical history on file.  Family History  Problem Relation Age of Onset  . Hypertension Mother   . Throat cancer Father   . Cancer Father   . Breast cancer Neg Hx     Social History   Socioeconomic History  . Marital status: Married    Spouse name: Not on file  . Number of children: Not on file  . Years of  education: Not on file  . Highest education level: Not on file  Occupational History  . Not on file  Social Needs  . Financial resource strain: Not on file  . Food insecurity:    Worry: Not on file    Inability: Not on file  . Transportation needs:    Medical: Not on file    Non-medical: Not on file  Tobacco Use  . Smoking status: Never Smoker  . Smokeless tobacco: Never Used  Substance and Sexual Activity  . Alcohol use: No  . Drug use: No  . Sexual activity: Not on file  Lifestyle  . Physical activity:    Days per week: Not on file    Minutes per session: Not on file  . Stress: Not on file  Relationships  . Social connections:    Talks on phone: Not on file    Gets together: Not on file    Attends religious service: Not on file    Active member of club or organization: Not on file    Attends meetings of clubs or organizations: Not on file    Relationship status: Not on file  . Intimate partner violence:    Fear of current or ex partner: Not on file    Emotionally abused: Not on file    Physically abused: Not on file    Forced sexual activity: Not on file  Other Topics Concern  .  Not on file  Social History Narrative  . Not on file    Review of Systems  Constitutional: Positive for diaphoresis and fatigue. Negative for chills and fever.  HENT: Negative.  Negative for congestion and sneezing.   Eyes: Negative.   Respiratory: Positive for shortness of breath.   Cardiovascular: Negative.   Gastrointestinal: Negative.   Endocrine: Negative.   Genitourinary: Negative.   Musculoskeletal: Negative.     Vitals:   07/31/18 1459  BP: 110/62  Pulse: 81  Temp: (!) 97.4 F (36.3 C)  SpO2: 97%     Physical Exam  Constitutional: She appears well-developed and well-nourished.  HENT:  Head: Normocephalic and atraumatic.  Eyes: Pupils are equal, round, and reactive to light. Conjunctivae are normal. Right eye exhibits no discharge. Left eye exhibits no discharge.    Neck: Normal range of motion. Neck supple. No tracheal deviation present. No thyromegaly present.  Cardiovascular: Normal rate and regular rhythm.  Pulmonary/Chest: Breath sounds normal. No respiratory distress. She has no wheezes. She has no rales.  Abdominal: Soft. Bowel sounds are normal. She exhibits no distension. There is no abdominal tenderness.   Data Reviewed: Recent records reviewed  Assessment:  Recent bronchitis Chronic fatigue Sweats  Plan/Recommendations: She has recently completed 2 courses of antibiotics, amoxicillin and Augmentin Recently completed course of prednisone  Baseline improvement in symptoms apart from the chest congestion, fullness and swelling  We will obtain a CBC with differential Obtain a ESR Obtain BMP Obtain chest x-ray  Will update patient's as results become available Encouraged to stay active Call with any significant concerns She will follow-up with Dr. Daiva Eves MD  [facility] 07/31/2018, 3:32 PM  CC: Shon Baton, MD

## 2018-07-31 NOTE — Patient Instructions (Signed)
Persistent symptoms leading to acute visit today Chest pressure  Obtain blood count, chest x-ray We will update you as results become available  Call with any significant concerns

## 2018-08-01 NOTE — Progress Notes (Signed)
Spoke with the pt and notified of results

## 2018-08-06 ENCOUNTER — Telehealth: Payer: Self-pay | Admitting: Pulmonary Disease

## 2018-08-06 NOTE — Telephone Encounter (Signed)
Patient's husband is calling stating patient's condition has worsened since first call which is causing the patient anxiety.  States acid reflux medication is not helping.  States she is experiencing muscle spasms and shortness of breath.  Dr. Keane Police nurse has recommended she go to the ER but patient is not wanting to do this, as she does not want to see a resident, she wants to see Dr. Halford Chessman. Patient's husband is aware VS is at the hospital today and will return to the office tomorrow.  He is aware that a message has been routed to Dr. Halford Chessman but that he is not currently in the office. Declined appt in our office today with another provider.  Juanda Crumble, (husband) CB is [phone] (cell).

## 2018-08-06 NOTE — Progress Notes (Signed)
$'@Patient'O$  ID: Annette Key, female    DOB: [birth year], 78 y.o.   MRN: [id number]  Chief Complaint  Patient presents with  . Acute Visit    Shortness of breath    Referring provider: Shon Baton, MD  HPI:  78 year old female never smoker followed in our office for suspected asthma  PMH: Suspected GERD Smoker/ Smoking History: Never Smoker  Maintenance: Was previously managed on Advair now none Pt of: Dr. Halford Chessman  08/07/2018  - Visit   78 year old female never smoker presenting to our office today for an acute visit.  Patient reports that her symptoms are unchanged from last office visit.  Patient was last seen for an acute visit chest x-ray was performed as well as blood work.  Chest x-ray and blood work was all normal.  Patient's previous FENO on 07/06/2018 was elevated at 37.  Patient was trialed on Singulair which did help her symptoms for the first 2 days and patient became anxious and stop this medication.  Patient's concerns regarding medication stems from many years ago when she had allergy testing?  Done at [facility] which all normalized after she stopped taking her thyroid medication.  Patient reports that she took mag citrate to flush out the thyroid medication and symptoms improved.  Now patient is extremely anxious whenever she takes pills.  Patient reports that previous PCP did suggest that patient may need anxiety medications but she did not start any.  Family also believes the patient may be suffering from anxiety as well as acid reflux.  Patient reports that she has acid reflux and GERD-like symptoms after almost every meal.  Patient tried to use over-the-counter Pepcid but said that she "felt terrible" when she took this.  Patient also endorses feeling tense as the day goes on.  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  Patient is also  already stressed about a March/2020 book club that she is hosting in her house.  Patient presents today with her spouse.  Patient also has a son in the area who is a dentist who is also suggested that patient be evaluated.  Patient has not seen primary care to discuss anxiety symptoms but she did contact them yesterday.   Tests:  FeNO 07/06/18 >> 37 Spirometry 07/06/18 >> FEV1 2.0 (100%), FEV1% 90  07/31/2018-CBC with differential- normal 12/13/4130- basic metabolic panel- normal 4/40/1027- ESR-18 07/31/2018-chest x-ray-lungs are clear, no acute abnormalities  FENO:  Lab Results  Component Value Date   NITRICOXIDE 37 07/06/2018    PFT: No flowsheet data found.  Imaging: Dg Chest 2 View  Result Date: 07/31/2018 CLINICAL DATA:  Shortness of breath intermittently for 2.5 months, chest pain, chronic fatigue EXAM: CHEST - 2 VIEW COMPARISON:  None FINDINGS: Normal heart size and pulmonary vascularity. Calcified tortuous thoracic aorta. Lungs clear. No pulmonary infiltrate, pleural effusion or pneumothorax. No acute osseous findings. IMPRESSION: No acute abnormalities. Electronically Signed   By: Lavonia Dana M.D.   On: 07/31/2018 15:51      Specialty Problems      Pulmonary Problems   Allergic asthma    07/06/2018-FeNO-37  Trial of Singulair 2 days of improvement patient stopped due to reaction 08/07/2018-trial of Symbicort 80 2 puffs daily         Allergies  Allergen Reactions  . Sulfa Antibiotics  Palpitations    Immunization History  Administered Date(s) Administered  . Influenza, High Dose Seasonal PF 04/30/2018  . Pneumococcal Polysaccharide-23 07/18/2005, 11/26/2012   UTD   Past Medical History:  Diagnosis Date  . Allergic rhinitis     Tobacco History: Social History   Tobacco Use  Smoking Status Never Smoker  Smokeless Tobacco Never Used   Counseling given: Yes  Continue to not smoke  Outpatient Encounter Medications as of 08/07/2018  Medication Sig    . Cholecalciferol (VITAMIN D-3) 5000 UNITS TABS Take 1 tablet by mouth daily.  . Multiple Vitamin (MULTIVITAMIN) tablet Take 1 tablet by mouth daily.  . budesonide-formoterol (SYMBICORT) 80-4.5 MCG/ACT inhaler Inhale 2 puffs into the lungs 2 (two) times daily.   No facility-administered encounter medications on file as of 08/07/2018.     Review of Systems  Review of Systems  Constitutional: Negative for fatigue and fever (feels hot ).       + Occasional night sweats only in her lower back  HENT: Negative for congestion, sinus pressure and sinus pain.   Respiratory: Positive for shortness of breath. Negative for cough and wheezing.   Cardiovascular: Negative for chest pain and palpitations.  Gastrointestinal: Negative for diarrhea, nausea and vomiting.       +gerd  +heartburn daily + Daily epigastric pain  +feels she has a gut biota issue  Psychiatric/Behavioral: The patient is nervous/anxious.      Physical Exam  BP 106/62 (BP Location: Left Arm, Cuff Size: Normal)   Pulse 68   Ht '5\' 4"'$  (1.626 m)   Wt 145 lb 3.2 oz (65.9 kg)   SpO2 97%   BMI 24.92 kg/m   Wt Readings from Last 5 Encounters:  08/07/18 145 lb 3.2 oz (65.9 kg)  07/31/18 149 lb (67.6 kg)  07/23/18 149 lb (67.6 kg)  07/06/18 150 lb 12.8 oz (68.4 kg)  02/17/14 146 lb (66.2 kg)   Physical Exam  Constitutional: She is oriented to person, place, and time and well-developed, well-nourished, and in no distress. No distress.  HENT:  Head: Normocephalic and atraumatic.  Right Ear: Hearing, tympanic membrane, external ear and ear canal normal.  Left Ear: Hearing, tympanic membrane, external ear and ear canal normal.  Nose: Mucosal edema present. Right sinus exhibits no maxillary sinus tenderness and no frontal sinus tenderness. Left sinus exhibits no maxillary sinus tenderness and no frontal sinus tenderness.  Mouth/Throat: Uvula is midline and oropharynx is clear and moist. Normal dentition. No dental caries. No  oropharyngeal exudate.  Eyes: Pupils are equal, round, and reactive to light.  Neck: Normal range of motion. Neck supple. No JVD present.  Cardiovascular: Normal rate, regular rhythm and normal heart sounds.  Pulmonary/Chest: Effort normal and breath sounds normal. No accessory muscle usage. No respiratory distress. She has no decreased breath sounds. She has no wheezes. She has no rhonchi.  Abdominal: Soft. Bowel sounds are normal. There is no abdominal tenderness.  Musculoskeletal: Normal range of motion.        General: No edema.  Lymphadenopathy:    She has no cervical adenopathy.  Neurological: She is alert and oriented to person, place, and time. Gait normal.  Skin: Skin is warm and dry. She is not diaphoretic. No erythema.  Psychiatric: Memory, affect and judgment normal. Her mood appears anxious. She does not exhibit a depressed mood. She expresses no homicidal and no suicidal ideation. She expresses no suicidal plans and no homicidal plans.  Nursing note and vitals reviewed.  Lab Results:  CBC    Component Value Date/Time   WBC 9.1 07/31/2018 1547   RBC 4.45 07/31/2018 1547   HGB 13.5 07/31/2018 1547   HCT 39.9 07/31/2018 1547   PLT 274.0 07/31/2018 1547   MCV 89.7 07/31/2018 1547   MCH 30.4 07/06/2018 1626   MCHC 33.8 07/31/2018 1547   RDW 13.8 07/31/2018 1547   LYMPHSABS 2.6 07/31/2018 1547   MONOABS 0.8 07/31/2018 1547   EOSABS 0.0 07/31/2018 1547   BASOSABS 0.1 07/31/2018 1547    BMET    Component Value Date/Time   NA 135 07/31/2018 1547   K 4.3 07/31/2018 1547   CL 101 07/31/2018 1547   CO2 26 07/31/2018 1547   GLUCOSE 107 (H) 07/31/2018 1547   BUN 13 07/31/2018 1547   CREATININE 0.77 07/31/2018 1547   CREATININE 0.94 (H) 07/06/2018 1626   CALCIUM 9.3 07/31/2018 1547   GFRNONAA 84 (L) 11/18/2012 1127   GFRAA >90 11/18/2012 1127    BNP No results found for: BNP  ProBNP No results found for: PROBNP    Assessment & Plan:     Anxiety Assessment: 78 year old female daily symptoms of anxiety Patient is no longer able to complete her normal activities and hobbies that she loves Resistant to taking any sort of new medication due to fear of having reaction Family and spouse both believe patient is having anxiety Visibly anxious throughout exam  Plan: Schedule an appointment with primary care to discuss anxiety symptoms Primary care is unable to see patient within 1 to 2 weeks to evaluate anxiety and patient to contact our office >>> Can discuss hydroxyzine as needed at that time for management of anxiety as well as allergic rhinitis Encouraged patient to also discuss with primary care referral to a therapist to discuss symptoms and management of her coping skills   Allergic asthma Assessment: October/2019 viral URI [employer] Shortness of breath after URI 07/06/2018-FeNO-37 Trial of Singulair showed 2 days of improvement of symptoms with and patient stopped due to undisclosed reaction to Singulair 07/31/2018-chest x-ray and lab work normal Patient was previously managed on Advair 100 which patient also stopped due to not liking it Lungs clear to auscultation today Mild AR flare nasal mucosa edema and rhinorrhea  Plan: Trial of Symbicort 80 today, 2 puffs daily for the next 2 weeks Follow-up in 2 weeks At next office visit could consider restart of Singulair, or daily antihistamine Repeat FeNO in March/2020   GERD (gastroesophageal reflux disease) Assessment: Daily epigastric pain Daily acid reflux symptoms Not following acid reflux diet Previous attempt at using Pepcid, patient did not like the medication  Plan: AVS material provided for patient today to start lifestyle changes as well as diet changes for management of acid reflux In a less anxious patient could have considered start of PPI We will defer start of PPI for 2-week office visit so that we can monitor for patient's symptoms and  reactions Offered GI referral which patient declined at this time Patient is very anxious about GI work-up Follow-up in 2 weeks Encouraged patient to also talk to primary care regarding the symptoms  At risk for negative response to medication Assessment: Multiple medication reactions within 1 to 2 days of starting Only true allergies to sulfa drugs Stems from reaction to thyroid medication when she was younger when she was managed at [place]: Trial of Symbicort 80 We will slowly work to add other medications as needed such as daily antihistamines or restarting Singulair  Could also consider trial of PPI in the future Patient to discuss generalized anxiety with primary care     Lauraine Rinne, NP 08/07/2018   This appointment was 43 minutes long with over 50% of the time in direct face-to-face patient care, assessment, plan of care, and follow-up.

## 2018-08-06 NOTE — Telephone Encounter (Signed)
Spoke with pt. She went in to detail with me about what's going on. Pt is having more increased shortness of breath. As the pt describes more of what is going on, she is describing a panic attack. I went over pursed lip breathing with the pt and advised her to do this when she feels she can't catch her breath. She wanted to schedule an appointment with Dr. Halford Chessman only. This has been scheduled for 08/08/2018 at 9:45am. I offered the pt several times to come in today or tomorrow to see an NP, she declined.  I attempted to speak to the pt back earlier this morning when she originally called but she did not want to speak to me, she only wanted to speak to Dr. Halford Chessman. This is why the message was sent to Dr. Halford Chessman.  Before hanging up with the pt, she seemed more at ease than she did when we first started speaking. Advised her to call us back if she needed [place] before her appointment with Dr. Halford Chessman on 08/08/2018.

## 2018-08-06 NOTE — Telephone Encounter (Signed)
Spoke with pt. After our conversation earlier, she started thinking that she needed to be seen sooner than her scheduled appointment with Dr. Halford Chessman on 08/08/2018. Pt's appointment has been moved up to 08/07/2018 at [place] with Aaron Edelman. Nothing further was needed at this time.

## 2018-08-06 NOTE — Telephone Encounter (Signed)
Spoke with pt. She did not want to speak with me. Pt only wants to speak to Dr. Halford Chessman.

## 2018-08-07 ENCOUNTER — Ambulatory Visit (INDEPENDENT_AMBULATORY_CARE_PROVIDER_SITE_OTHER): Payer: PPO | Admitting: Pulmonary Disease

## 2018-08-07 ENCOUNTER — Encounter: Payer: Self-pay | Admitting: Pulmonary Disease

## 2018-08-07 VITALS — BP 106/62 | HR 68 | Ht 64.0 in | Wt 145.2 lb

## 2018-08-07 DIAGNOSIS — J45909 Unspecified asthma, uncomplicated: Secondary | ICD-10-CM | POA: Insufficient documentation

## 2018-08-07 DIAGNOSIS — F419 Anxiety disorder, unspecified: Secondary | ICD-10-CM | POA: Diagnosis not present

## 2018-08-07 DIAGNOSIS — K219 Gastro-esophageal reflux disease without esophagitis: Secondary | ICD-10-CM | POA: Insufficient documentation

## 2018-08-07 DIAGNOSIS — J4520 Mild intermittent asthma, uncomplicated: Secondary | ICD-10-CM

## 2018-08-07 DIAGNOSIS — Z9189 Other specified personal risk factors, not elsewhere classified: Secondary | ICD-10-CM | POA: Diagnosis not present

## 2018-08-07 MED ORDER — BUDESONIDE-FORMOTEROL FUMARATE 80-4.5 MCG/ACT IN AERO: 2.0000 | INHALATION_SPRAY | Freq: Two times a day (BID) | RESPIRATORY_TRACT | 0 refills | Status: DC

## 2018-08-07 NOTE — Progress Notes (Signed)
Patient seen in the office today and instructed on use of Symbicort 80.  Patient expressed understanding and demonstrated technique.

## 2018-08-07 NOTE — Assessment & Plan Note (Signed)
Assessment: 78 year old female daily symptoms of anxiety Patient is no longer able to complete her normal activities and hobbies that she loves Resistant to taking any sort of new medication due to fear of having reaction Family and spouse both believe patient is having anxiety Visibly anxious throughout exam  Plan: Schedule an appointment with primary care to discuss anxiety symptoms Primary care is unable to see patient within 1 to 2 weeks to evaluate anxiety and patient to contact our office >>> Can discuss hydroxyzine as needed at that time for management of anxiety as well as allergic rhinitis Encouraged patient to also discuss with primary care referral to a therapist to discuss symptoms and management of her coping skills

## 2018-08-07 NOTE — Patient Instructions (Addendum)
Trial Symbicort 80 >>> 2 puffs in the morning right when you wake up, rinse out your mouth after use >>> Take this daily, no matter what >>> This is not a rescue inhaler   GERD management: >>>Avoid laying flat until 2 hours after meals >>>Elevate head of the bed including entire chest >>>Reduce size of meals and amount of fat, acid, spices, caffeine and sweets >>>If you are smoking, Please stop! >>>Decrease alcohol consumption >>>Work on maintaining a healthy weight with normal BMI  >>> Review AVS material listed below on acid reflux and foods to avoid  Contact primary care Dr. Keane Police office to discuss anxiety >>>I would discuss with him a chronic anti-anxiety management as well as may be short-term >>>Also could consider referral to therapist to discuss symptoms   Follow-up with our office in 2 weeks with Annette Quaker, FNP or Dr. Halford Chessman  It is flu season:   >>>Remember to be washing your hands regularly, using hand sanitizer, be careful to use around herself with has contact with people who are sick will increase her chances of getting sick yourself. >>> Best ways to protect herself from the flu: Receive the yearly flu vaccine, practice good hand hygiene washing with soap and also using hand sanitizer when available, eat a nutritious meals, get adequate rest, hydrate appropriately   Please contact the office if your symptoms worsen or you have concerns that you are not improving.   Thank you for choosing [facility] for your healthcare, and for allowing [place] to partner with you on your healthcare journey. I am thankful to be able to provide care to you today.   Annette Quaker FNP-C     Gastroesophageal Reflux Disease, Adult Gastroesophageal reflux (GER) happens when acid from the stomach flows up into the tube that connects the mouth and the stomach (esophagus). Normally, food travels down the esophagus and stays in the stomach to be digested. With GER, food and stomach acid  sometimes move back up into the esophagus. You may have a disease called gastroesophageal reflux disease (GERD) if the reflux:  Happens often.  Causes frequent or very bad symptoms.  Causes problems such as damage to the esophagus. When this happens, the esophagus becomes sore and swollen (inflamed). Over time, GERD can make small holes (ulcers) in the lining of the esophagus. What are the causes? This condition is caused by a problem with the muscle between the esophagus and the stomach. When this muscle is weak or not normal, it does not close properly to keep food and acid from coming back up from the stomach. The muscle can be weak because of:  Tobacco use.  Pregnancy.  Having a certain type of hernia (hiatal hernia).  Alcohol use.  Certain foods and drinks, such as coffee, chocolate, onions, and peppermint. What increases the risk? You are more likely to develop this condition if you:  Are overweight.  Have a disease that affects your connective tissue.  Use NSAID medicines. What are the signs or symptoms? Symptoms of this condition include:  Heartburn.  Difficult or painful swallowing.  The feeling of having a lump in the throat.  A bitter taste in the mouth.  Bad breath.  Having a lot of saliva.  Having an upset or bloated stomach.  Belching.  Chest pain. Different conditions can cause chest pain. Make sure you see your doctor if you have chest pain.  Shortness of breath or noisy breathing (wheezing).  Ongoing (chronic) cough or a cough at night.  Wearing away of the surface of teeth (tooth enamel).  Weight loss. How is this treated? Treatment will depend on how bad your symptoms are. Your doctor may suggest:  Changes to your diet.  Medicine.  Surgery. Follow these instructions at home: Eating and drinking   Follow a diet as told by your doctor. You may need to avoid foods and drinks such as: ? Coffee and tea (with or without  caffeine). ? Drinks that contain alcohol. ? Energy drinks and sports drinks. ? Bubbly (carbonated) drinks or sodas. ? Chocolate and cocoa. ? Peppermint and mint flavorings. ? Garlic and onions. ? Horseradish. ? Spicy and acidic foods. These include peppers, chili powder, curry powder, vinegar, hot sauces, and BBQ sauce. ? Citrus fruit juices and citrus fruits, such as oranges, lemons, and limes. ? Tomato-based foods. These include red sauce, chili, salsa, and pizza with red sauce. ? Fried and fatty foods. These include donuts, french fries, potato chips, and high-fat dressings. ? High-fat meats. These include hot dogs, rib eye steak, sausage, ham, and bacon. ? High-fat dairy items, such as whole milk, butter, and cream cheese.  Eat small meals often. Avoid eating large meals.  Avoid drinking large amounts of liquid with your meals.  Avoid eating meals during the 2-3 hours before bedtime.  Avoid lying down right after you eat.  Do not exercise right after you eat. Lifestyle   Do not use any products that contain nicotine or tobacco. These include cigarettes, e-cigarettes, and chewing tobacco. If you need help quitting, ask your doctor.  Try to lower your stress. If you need help doing this, ask your doctor.  If you are overweight, lose an amount of weight that is healthy for you. Ask your doctor about a safe weight loss goal. General instructions  Pay attention to any changes in your symptoms.  Take over-the-counter and prescription medicines only as told by your doctor. Do not take aspirin, ibuprofen, or other NSAIDs unless your doctor says it is okay.  Wear loose clothes. Do not wear anything tight around your waist.  Raise (elevate) the head of your bed about 6 inches (15 cm).  Avoid bending over if this makes your symptoms worse.  Keep all follow-up visits as told by your doctor. This is important. Contact a doctor if:  You have new symptoms.  You lose weight and  you do not know why.  You have trouble swallowing or it hurts to swallow.  You have wheezing or a cough that keeps happening.  Your symptoms do not get better with treatment.  You have a hoarse voice. Get help right away if:  You have pain in your arms, neck, jaw, teeth, or back.  You feel sweaty, dizzy, or light-headed.  You have chest pain or shortness of breath.  You throw up (vomit) and your throw-up looks like blood or coffee grounds.  You pass out (faint).  Your poop (stool) is bloody or black.  You cannot swallow, drink, or eat. Summary  If a person has gastroesophageal reflux disease (GERD), food and stomach acid move back up into the esophagus and cause symptoms or problems such as damage to the esophagus.  Treatment will depend on how bad your symptoms are.  Follow a diet as told by your doctor.  Take all medicines only as told by your doctor. This information is not intended to replace advice given to you by your health care provider. Make sure you discuss any questions you have with your health  care provider. Document Released: 12/21/2007 Document Revised: 01/10/2018 Document Reviewed: 01/10/2018 Elsevier Interactive Patient Education  2019 [employer] for Gastroesophageal Reflux Disease, Adult When you have gastroesophageal reflux disease (GERD), the foods you eat and your eating habits are very important. Choosing the right foods can help ease your discomfort. Think about working with a nutrition specialist (dietitian) to help you make good choices. What are tips for following this plan?  Meals  Choose healthy foods that are low in fat, such as fruits, vegetables, whole grains, low-fat dairy products, and lean meat, fish, and poultry.  Eat small meals often instead of 3 large meals a day. Eat your meals slowly, and in a place where you are relaxed. Avoid bending over or lying down until 2-3 hours after eating.  Avoid eating meals 2-3  hours before bed.  Avoid drinking a lot of liquid with meals.  Cook foods using methods other than frying. Bake, grill, or broil food instead.  Avoid or limit: ? Chocolate. ? Peppermint or spearmint. ? Alcohol. ? Pepper. ? Black and decaffeinated coffee. ? Black and decaffeinated tea. ? Bubbly (carbonated) soft drinks. ? Caffeinated energy drinks and soft drinks.  Limit high-fat foods such as: ? Fatty meat or fried foods. ? Whole milk, cream, butter, or ice cream. ? Nuts and nut butters. ? Pastries, donuts, and sweets made with butter or shortening.  Avoid foods that cause symptoms. These foods may be different for everyone. Common foods that cause symptoms include: ? Tomatoes. ? Oranges, lemons, and limes. ? Peppers. ? Spicy food. ? Onions and garlic. ? Vinegar. Lifestyle  Maintain a healthy weight. Ask your doctor what weight is healthy for you. If you need to lose weight, work with your doctor to do so safely.  Exercise for at least 30 minutes for 5 or more days each week, or as told by your doctor.  Wear loose-fitting clothes.  Do not smoke. If you need help quitting, ask your doctor.  Sleep with the head of your bed higher than your feet. Use a wedge under the mattress or blocks under the bed frame to raise the head of the bed. Summary  When you have gastroesophageal reflux disease (GERD), food and lifestyle choices are very important in easing your symptoms.  Eat small meals often instead of 3 large meals a day. Eat your meals slowly, and in a place where you are relaxed.  Limit high-fat foods such as fatty meat or fried foods.  Avoid bending over or lying down until 2-3 hours after eating.  Avoid peppermint and spearmint, caffeine, alcohol, and chocolate. This information is not intended to replace advice given to you by your health care provider. Make sure you discuss any questions you have with your health care provider. Document Released: 01/03/2012  Document Revised: 08/09/2016 Document Reviewed: 08/09/2016 Elsevier Interactive Patient Education  2019 [employer].

## 2018-08-07 NOTE — Progress Notes (Signed)
Reviewed and agree with assessment/plan.   Nikko Quast, MD [facility] 07/13/2016, 12:24 PM Pager:  [phone]  

## 2018-08-07 NOTE — Assessment & Plan Note (Signed)
Assessment: Multiple medication reactions within 1 to 2 days of starting Only true allergies to sulfa drugs Stems from reaction to thyroid medication when she was younger when she was managed at [place]: Trial of Symbicort 80 We will slowly work to add other medications as needed such as daily antihistamines or restarting Singulair Could also consider trial of PPI in the future Patient to discuss generalized anxiety with primary care

## 2018-08-07 NOTE — Assessment & Plan Note (Signed)
Assessment: Daily epigastric pain Daily acid reflux symptoms Not following acid reflux diet Previous attempt at using Pepcid, patient did not like the medication  Plan: AVS material provided for patient today to start lifestyle changes as well as diet changes for management of acid reflux In a less anxious patient could have considered start of PPI We will defer start of PPI for 2-week office visit so that we can monitor for patient's symptoms and reactions Offered GI referral which patient declined at this time Patient is very anxious about GI work-up Follow-up in 2 weeks Encouraged patient to also talk to primary care regarding the symptoms

## 2018-08-07 NOTE — Assessment & Plan Note (Addendum)
Assessment: October/2019 viral URI [place] [employer] Shortness of breath after URI 07/06/2018-FeNO-37 Trial of Singulair showed 2 days of improvement of symptoms with and patient stopped due to undisclosed reaction to Singulair 07/31/2018-chest x-ray and lab work normal Patient was previously managed on Advair 100 which patient also stopped due to not liking it Lungs clear to auscultation today Mild AR flare nasal mucosa edema and rhinorrhea  Plan: Trial of Symbicort 80 today, 2 puffs daily for the next 2 weeks Follow-up in 2 weeks At next office visit could consider restart of Singulair, or daily antihistamine Repeat FeNO in March/2020

## 2018-08-08 ENCOUNTER — Ambulatory Visit: Payer: PPO | Admitting: Pulmonary Disease

## 2018-08-09 ENCOUNTER — Telehealth: Payer: Self-pay | Admitting: Pulmonary Disease

## 2018-08-09 DIAGNOSIS — J383 Other diseases of vocal cords: Secondary | ICD-10-CM

## 2018-08-09 NOTE — Telephone Encounter (Signed)
Spoke with the pt  She agrees to referral to ENT  She is aware to stop the symbicort  Keep planned ov with Aaron Edelman for 08/21/2018

## 2018-08-09 NOTE — Telephone Encounter (Signed)
I called and spoke with the Annette Key  She was started on Symbicort 80 at last ov here with Aaron Edelman on 08/07/18 She states after she uses med she feels "incredibly tense" "but it's not anxiety"  She states she feels a tightness in her neck as if someone is grabbing her  She also states Symbicort makes her feel "horrible"- nausea and fatigue  Dr Halford Chessman, please advise thanks

## 2018-08-09 NOTE — Telephone Encounter (Signed)
She should stop using symbicort.  I am concerned she could have vocal cord dysfunction.  If she is agreeable, then please arrange for referral to ENT to further assess this.

## 2018-08-10 ENCOUNTER — Ambulatory Visit: Payer: PPO | Admitting: Pulmonary Disease

## 2018-08-10 ENCOUNTER — Telehealth: Payer: Self-pay | Admitting: Pulmonary Disease

## 2018-08-10 DIAGNOSIS — Z6824 Body mass index (BMI) 24.0-24.9, adult: Secondary | ICD-10-CM | POA: Diagnosis not present

## 2018-08-10 DIAGNOSIS — F419 Anxiety disorder, unspecified: Secondary | ICD-10-CM | POA: Diagnosis not present

## 2018-08-10 DIAGNOSIS — I10 Essential (primary) hypertension: Secondary | ICD-10-CM | POA: Diagnosis not present

## 2018-08-10 NOTE — Telephone Encounter (Signed)
Noted  

## 2018-08-10 NOTE — Telephone Encounter (Signed)
Called and spoke with Patient.  She is a VS patient that was last seen with Wyn Quaker, NP, 08/07/18, for asthma. She stated that she stopped her Symbicort yesterday, 08/09/18, because it was making her sweat, and feel tense.  She stated that last night she went to her book club and felt warm, tense, and was told she turned pale.  She had night sweats last night.  She stated that she does not have anxiety, but feels that she is fighting off infection. Asked if she saw or called her PCP.  She stated that she wanted to talk with Wyn Quaker, NP, because she saw him last.  Explained that he was currently in clinic, but I could schedule a OV.  OV with Wyn Quaker, NP, for 09/07/18, at 2:30pm.  Nothing further at this time.

## 2018-08-10 NOTE — Telephone Encounter (Signed)
08/10/2018  1014  I contacted the patient regarding her telephone triage message today.  The patient has already stopped the Symbicort 80 we were trialing her on.  The patient has contacted primary care about anxiety but never scheduled an office visit or requested an office visit as she was instructed when I saw her on 08/07/2018.  According the patient her primary care did prescribe anxiety medications but she is unsure what they are named that she is has not even opened her anxiety medications that were prescribed from primary care.  The patient has not discussed how and when to take the anxiety medications with primary care, she is confused on this.  I explained to the patient that we are not her primary care office. We will not be managing her anxiety medications chronically.  I also explained to the patient that she needs to contact primary care regarding her anxiety as I initially outlined in 08/07/2018 office visit and schedule a face-to-face office visit to go over managing her anxiety.  Her family also agrees that the patient struggles with anxiety and its become debilitating.  The patient remains afebrile, has recent lab work that was normal, recent chest x-ray that is normal, and benign exam on 08/07/2018.  Explained to the patient and more than happy to see her today if she has respiratory or pulmonary concerns.  She denies that at this time.  I emphasized the importance that she needs to contact Dr. Keane Police office regarding her anxiety and how to take these medications.  She said she would do so.  We will route to Dr. Halford Chessman as Juluis Rainier.  Wyn Quaker FNP

## 2018-08-17 DIAGNOSIS — R0602 Shortness of breath: Secondary | ICD-10-CM | POA: Diagnosis not present

## 2018-08-17 DIAGNOSIS — F411 Generalized anxiety disorder: Secondary | ICD-10-CM | POA: Diagnosis not present

## 2018-08-21 ENCOUNTER — Ambulatory Visit: Payer: PPO | Admitting: Pulmonary Disease

## 2018-08-21 NOTE — Progress Notes (Deleted)
_0  ID: Annette Key, female    DOB: Aug 23, 1940, 78 y.o.   MRN: [id number]  No chief complaint on file.   Referring provider: Shon Baton, MD  HPI:  78 year old female never smoker followed in our office for suspected asthma  PMH: Suspected GERD, Anxiety  Smoker/ Smoking History: Never Smoker  Maintenance: Was previously managed on Advair now none Pt of: Dr. Halford Chessman  08/21/2018  - Visit   HPI  Tests:   FeNO 07/06/18 >> 37 Spirometry 07/06/18 >> FEV1 2.0 (100%), FEV1% 90  07/31/2018-CBC with differential- normal 6/78/9381- basic metabolic panel- normal 0/17/5102- ESR-18 07/31/2018-chest x-ray-lungs are clear, no acute abnormalities  FENO:  Lab Results  Component Value Date   NITRICOXIDE 37 07/06/2018    PFT: No flowsheet data found.  Imaging: Dg Chest 2 View  Result Date: 07/31/2018 CLINICAL DATA:  Shortness of breath intermittently for 2.5 months, chest pain, chronic fatigue EXAM: CHEST - 2 VIEW COMPARISON:  None FINDINGS: Normal heart size and pulmonary vascularity. Calcified tortuous thoracic aorta. Lungs clear. No pulmonary infiltrate, pleural effusion or pneumothorax. No acute osseous findings. IMPRESSION: No acute abnormalities. Electronically Signed   By: Lavonia Dana M.D.   On: 07/31/2018 15:51      Specialty Problems      Pulmonary Problems   Allergic asthma    07/06/2018-FeNO-37  Trial of Singulair 2 days of improvement patient stopped due to reaction 08/07/2018-trial of Symbicort 80 2 puffs daily         Allergies  Allergen Reactions  . Sulfa Antibiotics Palpitations    Immunization History  Administered Date(s) Administered  . Influenza, High Dose Seasonal PF 04/30/2018  . Pneumococcal Polysaccharide-23 07/18/2005, 11/26/2012    Past Medical History:  Diagnosis Date  . Allergic rhinitis     Tobacco History: Social History   Tobacco Use  Smoking Status Never Smoker  Smokeless Tobacco Never Used   Counseling given: Not  Answered   Outpatient Encounter Medications as of 08/21/2018  Medication Sig  . budesonide-formoterol (SYMBICORT) 80-4.5 MCG/ACT inhaler Inhale 2 puffs into the lungs 2 (two) times daily.  . Cholecalciferol (VITAMIN D-3) 5000 UNITS TABS Take 1 tablet by mouth daily.  . Multiple Vitamin (MULTIVITAMIN) tablet Take 1 tablet by mouth daily.   No facility-administered encounter medications on file as of 08/21/2018.      Review of Systems  Review of Systems   Physical Exam  There were no vitals taken for this visit.  Wt Readings from Last 5 Encounters:  08/07/18 145 lb 3.2 oz (65.9 kg)  07/31/18 149 lb (67.6 kg)  07/23/18 149 lb (67.6 kg)  07/06/18 150 lb 12.8 oz (68.4 kg)  02/17/14 146 lb (66.2 kg)     Physical Exam    Lab Results:  CBC    Component Value Date/Time   WBC 9.1 07/31/2018 1547   RBC 4.45 07/31/2018 1547   HGB 13.5 07/31/2018 1547   HCT 39.9 07/31/2018 1547   PLT 274.0 07/31/2018 1547   MCV 89.7 07/31/2018 1547   MCH 30.4 07/06/2018 1626   MCHC 33.8 07/31/2018 1547   RDW 13.8 07/31/2018 1547   LYMPHSABS 2.6 07/31/2018 1547   MONOABS 0.8 07/31/2018 1547   EOSABS 0.0 07/31/2018 1547   BASOSABS 0.1 07/31/2018 1547    BMET    Component Value Date/Time   NA 135 07/31/2018 1547   K 4.3 07/31/2018 1547   CL 101 07/31/2018 1547   CO2 26 07/31/2018 1547   GLUCOSE 107 (  H) 07/31/2018 1547   BUN 13 07/31/2018 1547   CREATININE 0.77 07/31/2018 1547   CREATININE 0.94 (H) 07/06/2018 1626   CALCIUM 9.3 07/31/2018 1547   GFRNONAA 84 (L) 11/18/2012 1127   GFRAA >90 11/18/2012 1127    BNP No results found for: BNP  ProBNP No results found for: PROBNP    Assessment & Plan:     No problem-specific Assessment & Plan notes found for this encounter.     Lauraine Rinne, NP 08/21/2018   This appointment was *** with over 50% of the time in direct face-to-face patient care, assessment, plan of care, and follow-up.

## 2018-08-31 DIAGNOSIS — F419 Anxiety disorder, unspecified: Secondary | ICD-10-CM | POA: Diagnosis not present

## 2018-09-20 DIAGNOSIS — F320 Major depressive disorder, single episode, mild: Secondary | ICD-10-CM | POA: Diagnosis not present

## 2018-09-20 DIAGNOSIS — F419 Anxiety disorder, unspecified: Secondary | ICD-10-CM | POA: Diagnosis not present

## 2018-09-24 ENCOUNTER — Ambulatory Visit: Payer: PPO | Admitting: Pulmonary Disease

## 2018-09-28 DIAGNOSIS — F419 Anxiety disorder, unspecified: Secondary | ICD-10-CM | POA: Diagnosis not present

## 2018-09-28 DIAGNOSIS — F320 Major depressive disorder, single episode, mild: Secondary | ICD-10-CM | POA: Diagnosis not present

## 2018-10-03 DIAGNOSIS — F320 Major depressive disorder, single episode, mild: Secondary | ICD-10-CM | POA: Diagnosis not present

## 2018-10-03 DIAGNOSIS — L749 Eccrine sweat disorder, unspecified: Secondary | ICD-10-CM | POA: Diagnosis not present

## 2018-10-03 DIAGNOSIS — F419 Anxiety disorder, unspecified: Secondary | ICD-10-CM | POA: Diagnosis not present

## 2018-10-04 DIAGNOSIS — F419 Anxiety disorder, unspecified: Secondary | ICD-10-CM | POA: Diagnosis not present

## 2018-10-04 DIAGNOSIS — R05 Cough: Secondary | ICD-10-CM | POA: Diagnosis not present

## 2018-10-04 DIAGNOSIS — I10 Essential (primary) hypertension: Secondary | ICD-10-CM | POA: Diagnosis not present

## 2018-10-04 DIAGNOSIS — R0602 Shortness of breath: Secondary | ICD-10-CM | POA: Diagnosis not present

## 2018-10-10 DIAGNOSIS — F419 Anxiety disorder, unspecified: Secondary | ICD-10-CM | POA: Diagnosis not present

## 2018-10-17 DIAGNOSIS — F4323 Adjustment disorder with mixed anxiety and depressed mood: Secondary | ICD-10-CM | POA: Diagnosis not present

## 2018-10-30 DIAGNOSIS — F4322 Adjustment disorder with anxiety: Secondary | ICD-10-CM | POA: Diagnosis not present

## 2018-11-09 DIAGNOSIS — F419 Anxiety disorder, unspecified: Secondary | ICD-10-CM | POA: Diagnosis not present

## 2018-11-09 DIAGNOSIS — R634 Abnormal weight loss: Secondary | ICD-10-CM | POA: Diagnosis not present

## 2018-11-09 DIAGNOSIS — M5489 Other dorsalgia: Secondary | ICD-10-CM | POA: Diagnosis not present

## 2018-11-09 DIAGNOSIS — R110 Nausea: Secondary | ICD-10-CM | POA: Diagnosis not present

## 2018-11-12 ENCOUNTER — Other Ambulatory Visit: Payer: Self-pay | Admitting: Internal Medicine

## 2018-11-12 DIAGNOSIS — R110 Nausea: Secondary | ICD-10-CM

## 2018-11-12 DIAGNOSIS — R634 Abnormal weight loss: Secondary | ICD-10-CM

## 2018-11-12 DIAGNOSIS — F4322 Adjustment disorder with anxiety: Secondary | ICD-10-CM | POA: Diagnosis not present

## 2018-11-14 DIAGNOSIS — K5900 Constipation, unspecified: Secondary | ICD-10-CM | POA: Diagnosis not present

## 2018-11-14 DIAGNOSIS — N200 Calculus of kidney: Secondary | ICD-10-CM | POA: Diagnosis not present

## 2018-11-19 ENCOUNTER — Other Ambulatory Visit: Payer: PPO

## 2018-11-20 DIAGNOSIS — F4322 Adjustment disorder with anxiety: Secondary | ICD-10-CM | POA: Diagnosis not present

## 2018-11-21 DIAGNOSIS — F4322 Adjustment disorder with anxiety: Secondary | ICD-10-CM | POA: Diagnosis not present

## 2018-11-23 DIAGNOSIS — F4322 Adjustment disorder with anxiety: Secondary | ICD-10-CM | POA: Diagnosis not present

## 2018-11-26 DIAGNOSIS — R634 Abnormal weight loss: Secondary | ICD-10-CM | POA: Diagnosis not present

## 2018-11-26 DIAGNOSIS — I10 Essential (primary) hypertension: Secondary | ICD-10-CM | POA: Diagnosis not present

## 2018-11-26 DIAGNOSIS — M859 Disorder of bone density and structure, unspecified: Secondary | ICD-10-CM | POA: Diagnosis not present

## 2018-11-26 DIAGNOSIS — R739 Hyperglycemia, unspecified: Secondary | ICD-10-CM | POA: Diagnosis not present

## 2018-11-28 DIAGNOSIS — F4322 Adjustment disorder with anxiety: Secondary | ICD-10-CM | POA: Diagnosis not present

## 2018-11-28 DIAGNOSIS — H16223 Keratoconjunctivitis sicca, not specified as Sjogren's, bilateral: Secondary | ICD-10-CM | POA: Diagnosis not present

## 2018-11-30 DIAGNOSIS — R634 Abnormal weight loss: Secondary | ICD-10-CM | POA: Diagnosis not present

## 2018-11-30 DIAGNOSIS — F411 Generalized anxiety disorder: Secondary | ICD-10-CM | POA: Diagnosis not present

## 2018-11-30 DIAGNOSIS — I10 Essential (primary) hypertension: Secondary | ICD-10-CM | POA: Diagnosis not present

## 2018-11-30 DIAGNOSIS — R82998 Other abnormal findings in urine: Secondary | ICD-10-CM | POA: Diagnosis not present

## 2018-12-03 DIAGNOSIS — F411 Generalized anxiety disorder: Secondary | ICD-10-CM | POA: Diagnosis not present

## 2018-12-03 DIAGNOSIS — Z0000 Encounter for general adult medical examination without abnormal findings: Secondary | ICD-10-CM | POA: Diagnosis not present

## 2018-12-03 DIAGNOSIS — R110 Nausea: Secondary | ICD-10-CM | POA: Diagnosis not present

## 2018-12-03 DIAGNOSIS — M545 Low back pain: Secondary | ICD-10-CM | POA: Diagnosis not present

## 2018-12-03 DIAGNOSIS — I10 Essential (primary) hypertension: Secondary | ICD-10-CM | POA: Diagnosis not present

## 2018-12-03 DIAGNOSIS — R5383 Other fatigue: Secondary | ICD-10-CM | POA: Diagnosis not present

## 2018-12-03 DIAGNOSIS — R739 Hyperglycemia, unspecified: Secondary | ICD-10-CM | POA: Diagnosis not present

## 2018-12-03 DIAGNOSIS — K5900 Constipation, unspecified: Secondary | ICD-10-CM | POA: Diagnosis not present

## 2018-12-03 DIAGNOSIS — R634 Abnormal weight loss: Secondary | ICD-10-CM | POA: Diagnosis not present

## 2018-12-03 DIAGNOSIS — H539 Unspecified visual disturbance: Secondary | ICD-10-CM | POA: Diagnosis not present

## 2018-12-03 DIAGNOSIS — R451 Restlessness and agitation: Secondary | ICD-10-CM | POA: Diagnosis not present

## 2018-12-03 DIAGNOSIS — L409 Psoriasis, unspecified: Secondary | ICD-10-CM | POA: Diagnosis not present

## 2018-12-04 DIAGNOSIS — N200 Calculus of kidney: Secondary | ICD-10-CM | POA: Diagnosis not present

## 2018-12-05 ENCOUNTER — Telehealth: Payer: Self-pay | Admitting: Pulmonary Disease

## 2018-12-05 DIAGNOSIS — F4322 Adjustment disorder with anxiety: Secondary | ICD-10-CM | POA: Diagnosis not present

## 2018-12-05 NOTE — Telephone Encounter (Signed)
Assessment/Plan from [place] with VS 07/23/2018:   Asthma after recent upper respiratory infection. - advised her to resume singulair - she is reluctant to try steroid medications  Palpitations. - ECG normal today - advised her to follow up with her PCP if this persists  Intermittently reflux. - it seems less likely that she is having reflux as the cause of her symptoms - if she doesn't feel improvement after resuming singulair for a couple of weeks, then advise she could try OTC pepcid    Patient Instructions  Resume using singulair 10 mg pill at night  Follow up in 8 weeks   Called and spoke with Dr. Lenice Pressman telling him the results of pt's EKG that was performed at office. Asked Dr. Lenice Pressman if he would like [place] to fax the results to him and he stated he would like that to be done and the results can be faxed to [phone]. I have faxed the results to Dr. Lenice Pressman. Nothing further needed.

## 2018-12-06 DIAGNOSIS — F4322 Adjustment disorder with anxiety: Secondary | ICD-10-CM | POA: Diagnosis not present

## 2018-12-13 ENCOUNTER — Other Ambulatory Visit: Payer: Self-pay

## 2018-12-13 ENCOUNTER — Emergency Department (HOSPITAL_COMMUNITY)
Admission: EM | Admit: 2018-12-13 | Discharge: 2018-12-13 | Disposition: A | Discharge status: Home / Self Care | Payer: PPO | Attending: Emergency Medicine | Admitting: Emergency Medicine

## 2018-12-13 ENCOUNTER — Encounter (HOSPITAL_COMMUNITY): Payer: Self-pay

## 2018-12-13 DIAGNOSIS — Z79899 Other long term (current) drug therapy: Secondary | ICD-10-CM | POA: Diagnosis not present

## 2018-12-13 DIAGNOSIS — K5900 Constipation, unspecified: Secondary | ICD-10-CM | POA: Insufficient documentation

## 2018-12-13 MED ORDER — SORBITOL 70 % SOLN
960.0000 mL | TOPICAL_OIL | Freq: Once | ORAL | Status: AC
  Administered 2018-12-13: 960 mL via RECTAL
  Filled 2018-12-13: qty 473

## 2018-12-13 NOTE — ED Triage Notes (Signed)
Patient presents with constipation- stating it has been 6-7 days since her last bowel movement. Patient reports taking dulcolax for the past 3 days and took magnesium citrate yesterday. Patient also reports lower back pain, but "Doesn't know if the two correlate." Patient also reports extreme anxiety and states "I hope the doctor is available quickly, because I just cant sit here all day."

## 2018-12-13 NOTE — ED Provider Notes (Signed)
[facility] DEPT Provider Note   CSN: [id number] Arrival date & time: 12/13/18  4098    History   Chief Complaint Chief Complaint  Patient presents with  . Constipation    HPI Annette Key is a 78 y.o. female.     HPI   She presents for evaluation of difficulty having a bowel movement for 6 or 7 days.  She is using Dulcolax, and medium citrate without relief.  Her PCP ordered "images," which she was told were normal.  She denies fever, chills, nausea, vomiting, cough, shortness of breath, weakness or dizziness.  There are no other known modifying factors.  Past Medical History:  Diagnosis Date  . Allergic rhinitis     Patient Active Problem List   Diagnosis Date Noted  . Anxiety 08/07/2018  . Allergic asthma 08/07/2018  . GERD (gastroesophageal reflux disease) 08/07/2018  . At risk for negative response to medication 08/07/2018    History reviewed. No pertinent surgical history.   OB History   No obstetric history on file.      Home Medications    Prior to Admission medications   Medication Sig Start Date End Date Taking? Authorizing Provider  mirtazapine (REMERON) 15 MG tablet Take 15 mg by mouth at bedtime. 11/19/18  Yes [provider]  traZODone (DESYREL) 50 MG tablet Take 50 mg by mouth at bedtime. 11/27/18  Yes [provider]  ADVAIR DISKUS 100-50 MCG/DOSE AEPB Inhale 1 puff into the lungs 2 (two) times daily as needed for shortness of breath. 06/21/18   [provider]  budesonide-formoterol (SYMBICORT) 80-4.5 MCG/ACT inhaler Inhale 2 puffs into the lungs 2 (two) times daily. 08/07/18   Lauraine Rinne, NP  Cholecalciferol (VITAMIN D-3) 5000 UNITS TABS Take 1 tablet by mouth daily.    [provider]  Multiple Vitamin (MULTIVITAMIN) tablet Take 1 tablet by mouth daily.    [provider]    Family History Family History  Problem Relation Age of Onset  . Hypertension Mother   .  Throat cancer Father   . Cancer Father   . Breast cancer Neg Hx     Social History Social History   Tobacco Use  . Smoking status: Never Smoker  . Smokeless tobacco: Never Used  Substance Use Topics  . Alcohol use: No  . Drug use: No     Allergies   Sulfa antibiotics   Review of Systems Review of Systems  All other systems reviewed and are negative.    Physical Exam Updated Vital Signs BP 138/79 (BP Location: Right Arm)   Pulse 78   Temp 98.1 F (36.7 C) (Oral)   Resp 16   Ht 5\' 5"  (1.651 m)   Wt 59.4 kg   SpO2 100%   BMI 21.80 kg/m   Physical Exam Vitals signs and nursing note reviewed.  Constitutional:      General: She is not in acute distress.    Appearance: She is well-developed. She is not ill-appearing, toxic-appearing or diaphoretic.  HENT:     Head: Normocephalic and atraumatic.     Right Ear: External ear normal.     Left Ear: External ear normal.  Eyes:     Conjunctiva/sclera: Conjunctivae normal.     Pupils: Pupils are equal, round, and reactive to light.  Neck:     Musculoskeletal: Normal range of motion and neck supple.     Trachea: Phonation normal.  Cardiovascular:     Rate and Rhythm:  Normal rate.  Pulmonary:     Effort: Pulmonary effort is normal.  Abdominal:     General: There is no distension.     Palpations: Abdomen is soft.     Tenderness: There is no abdominal tenderness. There is no guarding.  Genitourinary:    Comments: Normal anus, empty rectum.  No palpable or visible hemorrhoids.  No blood in rectum.  No fecal impaction. Musculoskeletal: Normal range of motion.  Skin:    General: Skin is warm and dry.  Neurological:     Mental Status: She is alert and oriented to person, place, and time.     Cranial Nerves: No cranial nerve deficit.     Sensory: No sensory deficit.     Motor: No abnormal muscle tone.     Coordination: Coordination normal.  Psychiatric:        Behavior: Behavior normal.        Thought Content:  Thought content normal.        Judgment: Judgment normal.      ED Treatments / Results  Labs (all labs ordered are listed, but only abnormal results are displayed) Labs Reviewed - No data to display  EKG None  Radiology No results found.  Procedures Procedures (including critical care time)  Medications Ordered in ED Medications  sorbitol, milk of mag, mineral oil, glycerin (SMOG) enema (960 mLs Rectal Given 12/13/18 1120)     Initial Impression / Assessment and Plan / ED Course  I have reviewed the triage vital signs and the nursing notes.  Pertinent labs & imaging results that were available during my care of the patient were reviewed by me and considered in my medical decision making (see chart for details).         Patient Vitals for the past 24 hrs:  BP Temp Temp src Pulse Resp SpO2 Height Weight  12/13/18 1259 138/79 - - 78 16 100 % - -  12/13/18 1010 - - - - - - 5\' 5"  (1.651 m) 59.4 kg  12/13/18 1009 123/78 98.1 F (36.7 C) Oral 80 18 100 % - -    1:02 PM Reevaluation with update and discussion. After initial assessment and treatment, an updated evaluation reveals she had good results from the enema and is ready to go home.  Findings discussed and questions answered. Daleen Bo   Medical Decision Making: Evaluation consistent with uncomplicated constipation.  Doubt bowel obstruction or metabolic instability.  CRITICAL CARE-no Performed by: Daleen Bo   Nursing Notes Reviewed/ Care Coordinated Applicable Imaging Reviewed Interpretation of Laboratory Data incorporated into ED treatment  The patient appears reasonably screened and/or stabilized for discharge and I doubt any other medical condition or other [facility] requiring further screening, evaluation, or treatment in the ED at this time prior to discharge.  Plan: Home Medications-continue usual and use Colace twice daily; Home Treatments-rest, fluids, fiber; return here if the recommended treatment,  does not improve the symptoms; Recommended follow up-PCP, prn    Final Clinical Impressions(s) / ED Diagnoses   Final diagnoses:  Constipation, unspecified constipation type    ED Discharge Orders    None       Daleen Bo, MD 12/13/18 1303

## 2018-12-13 NOTE — Discharge Instructions (Addendum)
Use a stool softener like Colace twice a day for a week or 2.  Increase the amount of fiber in your diet and drink plenty of water every day.

## 2018-12-17 DIAGNOSIS — Z961 Presence of intraocular lens: Secondary | ICD-10-CM | POA: Diagnosis not present

## 2018-12-17 DIAGNOSIS — H43813 Vitreous degeneration, bilateral: Secondary | ICD-10-CM | POA: Diagnosis not present

## 2018-12-17 DIAGNOSIS — H35371 Puckering of macula, right eye: Secondary | ICD-10-CM | POA: Diagnosis not present

## 2018-12-17 DIAGNOSIS — H16223 Keratoconjunctivitis sicca, not specified as Sjogren's, bilateral: Secondary | ICD-10-CM | POA: Diagnosis not present

## 2018-12-19 DIAGNOSIS — R1032 Left lower quadrant pain: Secondary | ICD-10-CM | POA: Diagnosis not present

## 2018-12-19 DIAGNOSIS — F419 Anxiety disorder, unspecified: Secondary | ICD-10-CM | POA: Diagnosis not present

## 2018-12-19 DIAGNOSIS — K5900 Constipation, unspecified: Secondary | ICD-10-CM | POA: Diagnosis not present

## 2018-12-21 DIAGNOSIS — F4322 Adjustment disorder with anxiety: Secondary | ICD-10-CM | POA: Diagnosis not present

## 2018-12-31 DIAGNOSIS — F419 Anxiety disorder, unspecified: Secondary | ICD-10-CM | POA: Diagnosis not present

## 2018-12-31 DIAGNOSIS — F321 Major depressive disorder, single episode, moderate: Secondary | ICD-10-CM | POA: Diagnosis not present

## 2018-12-31 DIAGNOSIS — K5900 Constipation, unspecified: Secondary | ICD-10-CM | POA: Diagnosis not present

## 2018-12-31 DIAGNOSIS — G4700 Insomnia, unspecified: Secondary | ICD-10-CM | POA: Diagnosis not present

## 2018-12-31 DIAGNOSIS — R748 Abnormal levels of other serum enzymes: Secondary | ICD-10-CM | POA: Diagnosis not present

## 2018-12-31 DIAGNOSIS — M545 Low back pain: Secondary | ICD-10-CM | POA: Diagnosis not present

## 2018-12-31 DIAGNOSIS — M546 Pain in thoracic spine: Secondary | ICD-10-CM | POA: Diagnosis not present

## 2019-01-02 DIAGNOSIS — Z961 Presence of intraocular lens: Secondary | ICD-10-CM | POA: Diagnosis not present

## 2019-01-02 DIAGNOSIS — H43811 Vitreous degeneration, right eye: Secondary | ICD-10-CM | POA: Diagnosis not present

## 2019-01-02 DIAGNOSIS — H35371 Puckering of macula, right eye: Secondary | ICD-10-CM | POA: Diagnosis not present

## 2019-01-03 DIAGNOSIS — H35371 Puckering of macula, right eye: Secondary | ICD-10-CM | POA: Diagnosis not present

## 2019-01-03 DIAGNOSIS — Z03818 Encounter for observation for suspected exposure to other biological agents ruled out: Secondary | ICD-10-CM | POA: Diagnosis not present

## 2019-01-03 DIAGNOSIS — F321 Major depressive disorder, single episode, moderate: Secondary | ICD-10-CM | POA: Diagnosis not present

## 2019-01-03 DIAGNOSIS — H16223 Keratoconjunctivitis sicca, not specified as Sjogren's, bilateral: Secondary | ICD-10-CM | POA: Diagnosis not present

## 2019-01-03 DIAGNOSIS — R748 Abnormal levels of other serum enzymes: Secondary | ICD-10-CM | POA: Diagnosis not present

## 2019-01-03 DIAGNOSIS — Z961 Presence of intraocular lens: Secondary | ICD-10-CM | POA: Diagnosis not present

## 2019-01-14 DIAGNOSIS — F419 Anxiety disorder, unspecified: Secondary | ICD-10-CM | POA: Diagnosis not present

## 2019-01-14 DIAGNOSIS — K5900 Constipation, unspecified: Secondary | ICD-10-CM | POA: Diagnosis not present

## 2019-01-22 ENCOUNTER — Other Ambulatory Visit: Payer: Self-pay | Admitting: Family Medicine

## 2019-01-22 DIAGNOSIS — R410 Disorientation, unspecified: Secondary | ICD-10-CM

## 2019-01-23 DIAGNOSIS — F321 Major depressive disorder, single episode, moderate: Secondary | ICD-10-CM | POA: Diagnosis not present

## 2019-01-23 DIAGNOSIS — M545 Low back pain: Secondary | ICD-10-CM | POA: Diagnosis not present

## 2019-01-23 DIAGNOSIS — G4700 Insomnia, unspecified: Secondary | ICD-10-CM | POA: Diagnosis not present

## 2019-01-23 DIAGNOSIS — M546 Pain in thoracic spine: Secondary | ICD-10-CM | POA: Diagnosis not present

## 2019-01-23 DIAGNOSIS — F419 Anxiety disorder, unspecified: Secondary | ICD-10-CM | POA: Diagnosis not present

## 2019-01-23 DIAGNOSIS — K5900 Constipation, unspecified: Secondary | ICD-10-CM | POA: Diagnosis not present

## 2019-01-28 DIAGNOSIS — H04123 Dry eye syndrome of bilateral lacrimal glands: Secondary | ICD-10-CM | POA: Diagnosis not present

## 2019-01-28 DIAGNOSIS — M3500 Sicca syndrome, unspecified: Secondary | ICD-10-CM | POA: Diagnosis not present

## 2019-01-28 DIAGNOSIS — H16223 Keratoconjunctivitis sicca, not specified as Sjogren's, bilateral: Secondary | ICD-10-CM | POA: Diagnosis not present

## 2019-01-29 DIAGNOSIS — F419 Anxiety disorder, unspecified: Secondary | ICD-10-CM | POA: Diagnosis not present

## 2019-01-29 DIAGNOSIS — Z6823 Body mass index (BMI) 23.0-23.9, adult: Secondary | ICD-10-CM | POA: Diagnosis not present

## 2019-01-29 DIAGNOSIS — M545 Low back pain: Secondary | ICD-10-CM | POA: Diagnosis not present

## 2019-01-29 DIAGNOSIS — R768 Other specified abnormal immunological findings in serum: Secondary | ICD-10-CM | POA: Diagnosis not present

## 2019-02-01 DIAGNOSIS — G4700 Insomnia, unspecified: Secondary | ICD-10-CM | POA: Diagnosis not present

## 2019-02-01 DIAGNOSIS — R110 Nausea: Secondary | ICD-10-CM | POA: Diagnosis not present

## 2019-02-01 DIAGNOSIS — F419 Anxiety disorder, unspecified: Secondary | ICD-10-CM | POA: Diagnosis not present

## 2019-02-01 DIAGNOSIS — F321 Major depressive disorder, single episode, moderate: Secondary | ICD-10-CM | POA: Diagnosis not present

## 2019-02-11 ENCOUNTER — Other Ambulatory Visit: Payer: Self-pay | Admitting: Family Medicine

## 2019-02-11 ENCOUNTER — Ambulatory Visit
Admission: RE | Admit: 2019-02-11 | Discharge: 2019-02-11 | Disposition: A | Discharge status: Home / Self Care | Payer: PPO | Source: Ambulatory Visit | Attending: Family Medicine | Admitting: Family Medicine

## 2019-02-11 ENCOUNTER — Other Ambulatory Visit: Payer: Self-pay

## 2019-02-11 DIAGNOSIS — R410 Disorientation, unspecified: Secondary | ICD-10-CM

## 2019-02-11 DIAGNOSIS — R079 Chest pain, unspecified: Secondary | ICD-10-CM

## 2019-02-11 DIAGNOSIS — R0789 Other chest pain: Secondary | ICD-10-CM | POA: Diagnosis not present

## 2019-02-11 MED ORDER — GADOBENATE DIMEGLUMINE 529 MG/ML IV SOLN
12.0000 mL | Freq: Once | INTRAVENOUS | Status: AC | PRN
  Administered 2019-02-11: 12 mL via INTRAVENOUS

## 2019-03-04 DIAGNOSIS — S22060D Wedge compression fracture of T7-T8 vertebra, subsequent encounter for fracture with routine healing: Secondary | ICD-10-CM | POA: Diagnosis not present

## 2019-03-04 DIAGNOSIS — S22050D Wedge compression fracture of T5-T6 vertebra, subsequent encounter for fracture with routine healing: Secondary | ICD-10-CM | POA: Diagnosis not present

## 2019-03-04 DIAGNOSIS — R110 Nausea: Secondary | ICD-10-CM | POA: Diagnosis not present

## 2019-03-04 DIAGNOSIS — S22080D Wedge compression fracture of T11-T12 vertebra, subsequent encounter for fracture with routine healing: Secondary | ICD-10-CM | POA: Diagnosis not present

## 2019-03-04 DIAGNOSIS — M546 Pain in thoracic spine: Secondary | ICD-10-CM | POA: Diagnosis not present

## 2019-03-04 DIAGNOSIS — F419 Anxiety disorder, unspecified: Secondary | ICD-10-CM | POA: Diagnosis not present

## 2019-03-04 DIAGNOSIS — F5101 Primary insomnia: Secondary | ICD-10-CM | POA: Diagnosis not present

## 2019-03-04 DIAGNOSIS — F321 Major depressive disorder, single episode, moderate: Secondary | ICD-10-CM | POA: Diagnosis not present

## 2019-03-11 DIAGNOSIS — F4322 Adjustment disorder with anxiety: Secondary | ICD-10-CM | POA: Diagnosis not present

## 2019-03-18 DIAGNOSIS — F4322 Adjustment disorder with anxiety: Secondary | ICD-10-CM | POA: Diagnosis not present

## 2019-03-25 DIAGNOSIS — F329 Major depressive disorder, single episode, unspecified: Secondary | ICD-10-CM | POA: Diagnosis not present

## 2019-03-25 DIAGNOSIS — F061 Catatonic disorder due to known physiological condition: Secondary | ICD-10-CM | POA: Diagnosis not present

## 2019-03-28 DIAGNOSIS — F4322 Adjustment disorder with anxiety: Secondary | ICD-10-CM | POA: Diagnosis not present

## 2019-03-29 ENCOUNTER — Other Ambulatory Visit: Payer: Self-pay | Admitting: Family Medicine

## 2019-03-29 DIAGNOSIS — M546 Pain in thoracic spine: Secondary | ICD-10-CM

## 2019-03-30 DIAGNOSIS — F332 Major depressive disorder, recurrent severe without psychotic features: Secondary | ICD-10-CM | POA: Diagnosis not present

## 2019-03-30 DIAGNOSIS — F411 Generalized anxiety disorder: Secondary | ICD-10-CM | POA: Diagnosis not present

## 2019-04-13 ENCOUNTER — Ambulatory Visit
Admission: RE | Admit: 2019-04-13 | Discharge: 2019-04-13 | Disposition: A | Discharge status: Home / Self Care | Payer: PPO | Source: Ambulatory Visit | Attending: Family Medicine | Admitting: Family Medicine

## 2019-04-13 ENCOUNTER — Other Ambulatory Visit: Payer: Self-pay

## 2019-04-13 DIAGNOSIS — M546 Pain in thoracic spine: Secondary | ICD-10-CM

## 2019-04-13 DIAGNOSIS — S22050A Wedge compression fracture of T5-T6 vertebra, initial encounter for closed fracture: Secondary | ICD-10-CM | POA: Diagnosis not present

## 2019-04-13 DIAGNOSIS — S22060A Wedge compression fracture of T7-T8 vertebra, initial encounter for closed fracture: Secondary | ICD-10-CM | POA: Diagnosis not present

## 2019-04-24 DIAGNOSIS — F4322 Adjustment disorder with anxiety: Secondary | ICD-10-CM | POA: Diagnosis not present

## 2019-04-29 DIAGNOSIS — F061 Catatonic disorder due to known physiological condition: Secondary | ICD-10-CM | POA: Diagnosis not present

## 2019-04-29 DIAGNOSIS — F329 Major depressive disorder, single episode, unspecified: Secondary | ICD-10-CM | POA: Diagnosis not present

## 2019-04-30 DIAGNOSIS — F321 Major depressive disorder, single episode, moderate: Secondary | ICD-10-CM | POA: Diagnosis not present

## 2019-04-30 DIAGNOSIS — F419 Anxiety disorder, unspecified: Secondary | ICD-10-CM | POA: Diagnosis not present

## 2019-04-30 DIAGNOSIS — S32000D Wedge compression fracture of unspecified lumbar vertebra, subsequent encounter for fracture with routine healing: Secondary | ICD-10-CM | POA: Diagnosis not present

## 2019-04-30 DIAGNOSIS — R110 Nausea: Secondary | ICD-10-CM | POA: Diagnosis not present

## 2019-05-08 DIAGNOSIS — F4322 Adjustment disorder with anxiety: Secondary | ICD-10-CM | POA: Diagnosis not present

## 2019-05-13 DIAGNOSIS — F061 Catatonic disorder due to known physiological condition: Secondary | ICD-10-CM | POA: Diagnosis not present

## 2019-05-13 DIAGNOSIS — F329 Major depressive disorder, single episode, unspecified: Secondary | ICD-10-CM | POA: Diagnosis not present

## 2019-05-31 DIAGNOSIS — F329 Major depressive disorder, single episode, unspecified: Secondary | ICD-10-CM | POA: Diagnosis not present

## 2019-05-31 DIAGNOSIS — F061 Catatonic disorder due to known physiological condition: Secondary | ICD-10-CM | POA: Diagnosis not present

## 2019-06-02 IMAGING — DX DG CHEST 2V
2 series · 2 of 2 positions shown · non-contrast
Comparison: None

CLINICAL DATA: Shortness of breath intermittently for 2.5 months,
chest pain, chronic fatigue

EXAM:
CHEST - 2 VIEW

[chest pa]
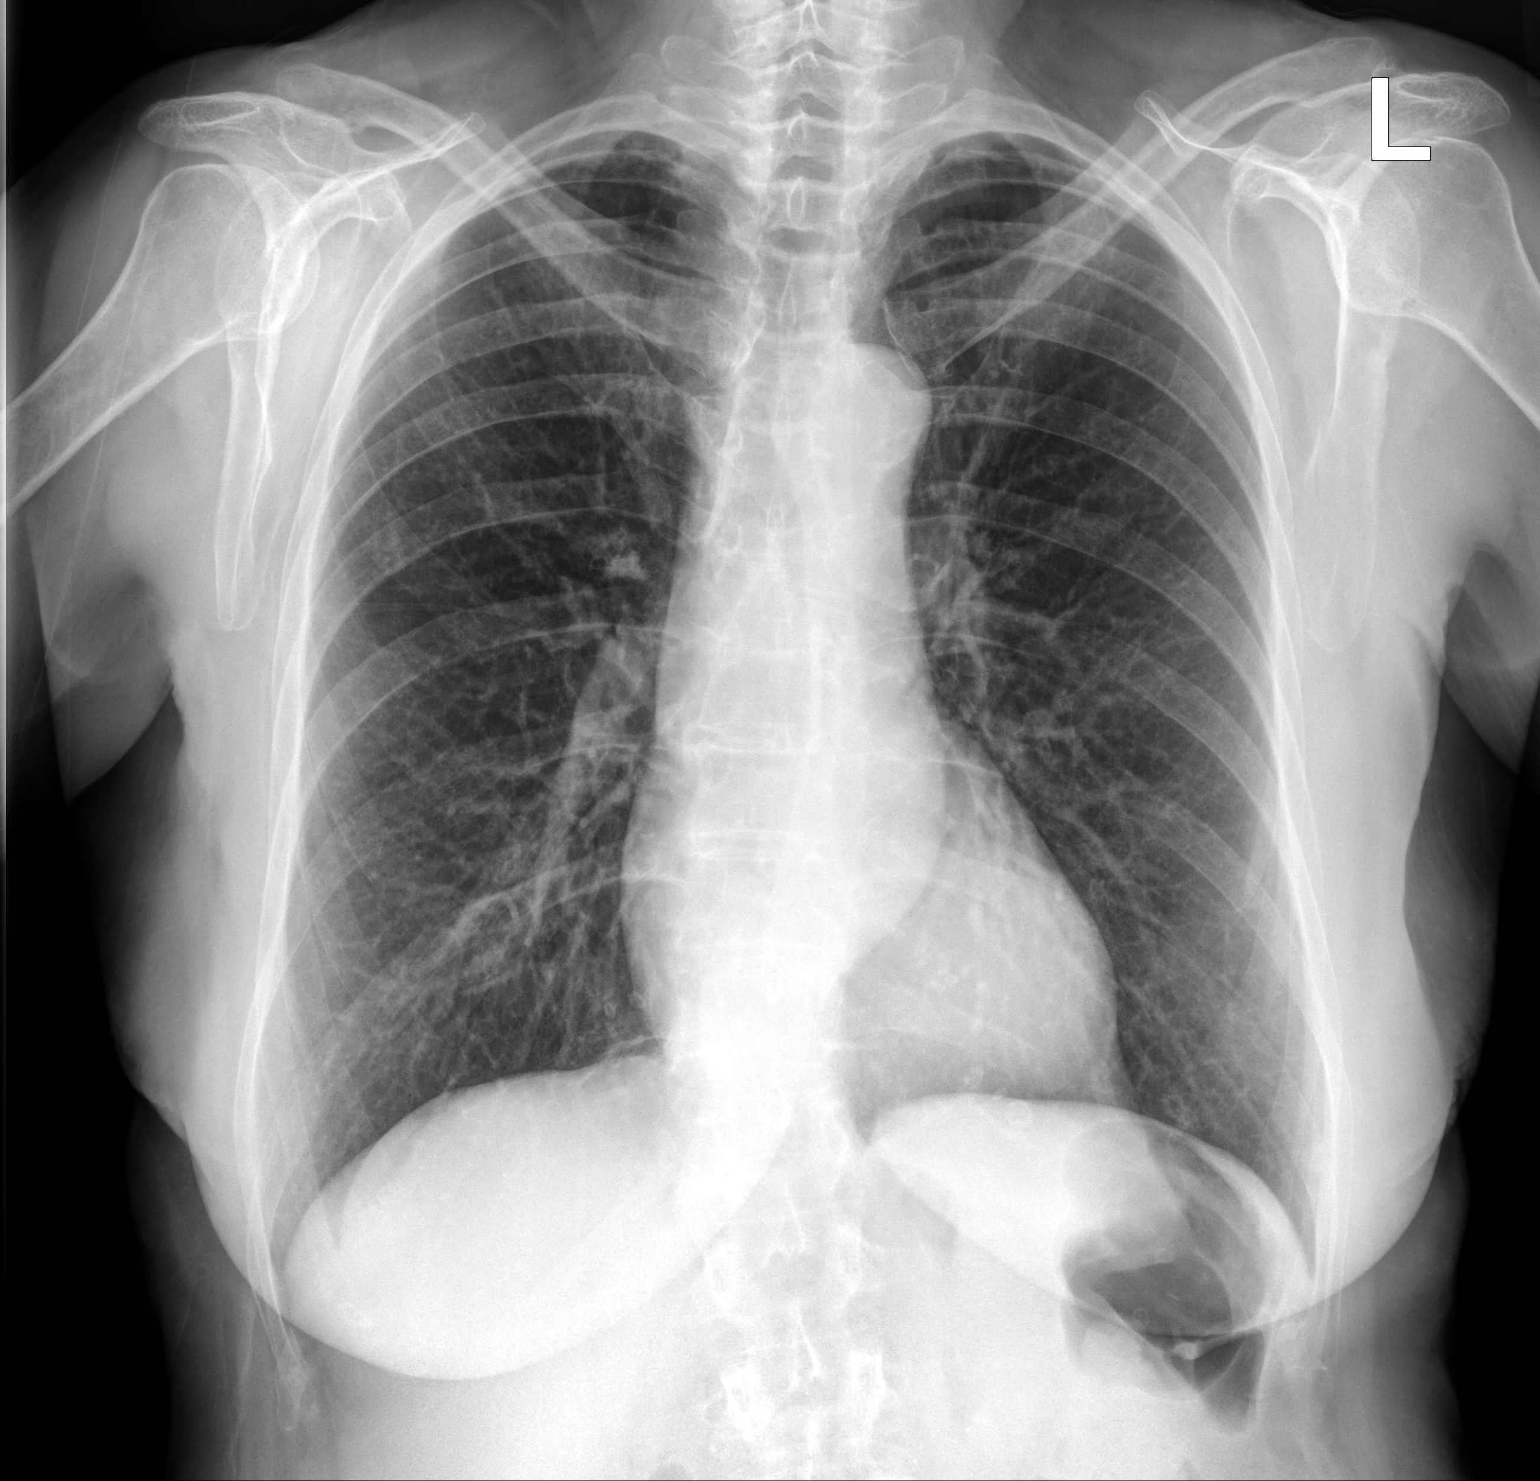

[chest lat]
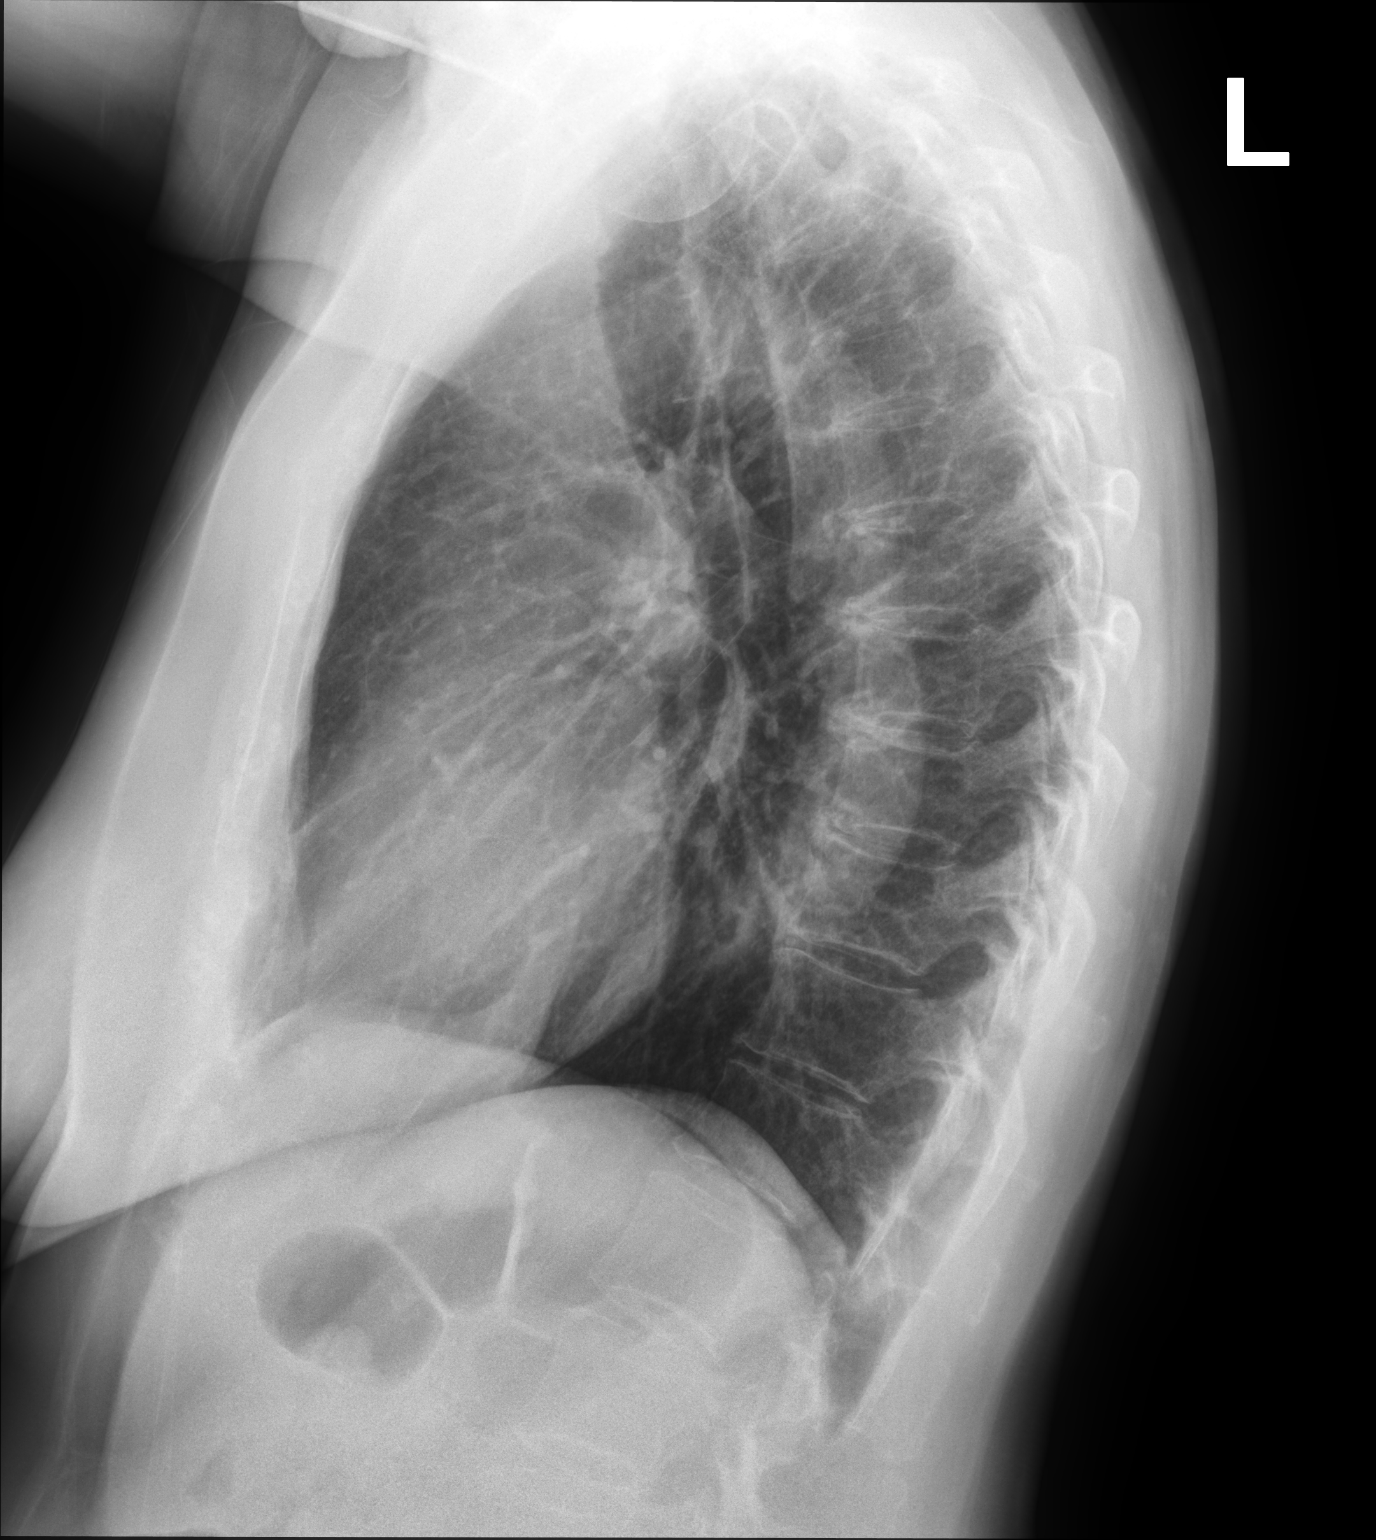

[2 of 2 positions shown; findings below may reference images not displayed]

FINDINGS: Normal heart size and pulmonary vascularity.

Calcified tortuous thoracic aorta.

Lungs clear.

No pulmonary infiltrate, pleural effusion or pneumothorax.

No acute osseous findings.
IMPRESSION: No acute abnormalities.

## 2019-06-04 DIAGNOSIS — F329 Major depressive disorder, single episode, unspecified: Secondary | ICD-10-CM | POA: Diagnosis not present

## 2019-06-04 DIAGNOSIS — F061 Catatonic disorder due to known physiological condition: Secondary | ICD-10-CM | POA: Diagnosis not present

## 2019-06-07 DIAGNOSIS — F061 Catatonic disorder due to known physiological condition: Secondary | ICD-10-CM | POA: Diagnosis not present

## 2019-06-07 DIAGNOSIS — F329 Major depressive disorder, single episode, unspecified: Secondary | ICD-10-CM | POA: Diagnosis not present

## 2019-06-18 DIAGNOSIS — Z23 Encounter for immunization: Secondary | ICD-10-CM | POA: Diagnosis not present

## 2019-06-25 DIAGNOSIS — F329 Major depressive disorder, single episode, unspecified: Secondary | ICD-10-CM | POA: Diagnosis not present

## 2019-06-25 DIAGNOSIS — F061 Catatonic disorder due to known physiological condition: Secondary | ICD-10-CM | POA: Diagnosis not present

## 2019-07-15 DIAGNOSIS — F061 Catatonic disorder due to known physiological condition: Secondary | ICD-10-CM | POA: Diagnosis not present

## 2019-07-15 DIAGNOSIS — F329 Major depressive disorder, single episode, unspecified: Secondary | ICD-10-CM | POA: Diagnosis not present

## 2019-07-25 DIAGNOSIS — F061 Catatonic disorder due to known physiological condition: Secondary | ICD-10-CM | POA: Diagnosis not present

## 2019-07-25 DIAGNOSIS — F329 Major depressive disorder, single episode, unspecified: Secondary | ICD-10-CM | POA: Diagnosis not present

## 2019-08-01 DIAGNOSIS — F329 Major depressive disorder, single episode, unspecified: Secondary | ICD-10-CM | POA: Diagnosis not present

## 2019-08-01 DIAGNOSIS — F061 Catatonic disorder due to known physiological condition: Secondary | ICD-10-CM | POA: Diagnosis not present

## 2019-08-10 ENCOUNTER — Ambulatory Visit: Payer: PPO | Attending: Internal Medicine

## 2019-08-10 DIAGNOSIS — Z23 Encounter for immunization: Secondary | ICD-10-CM

## 2019-08-10 NOTE — Progress Notes (Signed)
   Covid-19 Vaccination Clinic  Name:  Annette Key    MRN: [id number] DOB: 1940-08-07  08/10/2019  [name] was observed post Covid-19 immunization for 15 minutes without incidence. She was provided with Vaccine Information Sheet and instruction to access the V-Safe system.   [name] was instructed to call 911 with any severe reactions post vaccine: Marland Kitchen Difficulty breathing  . Swelling of your face and throat  . A fast heartbeat  . A bad rash all over your body  . Dizziness and weakness    Immunizations Administered    Name Date Dose VIS Date Route   Pfizer COVID-19 Vaccine 08/10/2019 11:18 AM 0.3 mL 06/28/2019 Intramuscular   Manufacturer: [place]   Lot: [id number]   [place]: [id number]

## 2019-08-26 DIAGNOSIS — F061 Catatonic disorder due to known physiological condition: Secondary | ICD-10-CM | POA: Diagnosis not present

## 2019-08-26 DIAGNOSIS — F329 Major depressive disorder, single episode, unspecified: Secondary | ICD-10-CM | POA: Diagnosis not present

## 2019-08-30 DIAGNOSIS — F329 Major depressive disorder, single episode, unspecified: Secondary | ICD-10-CM | POA: Diagnosis not present

## 2019-08-30 DIAGNOSIS — F061 Catatonic disorder due to known physiological condition: Secondary | ICD-10-CM | POA: Diagnosis not present

## 2019-08-31 ENCOUNTER — Ambulatory Visit: Payer: PPO | Attending: Internal Medicine

## 2019-08-31 DIAGNOSIS — Z23 Encounter for immunization: Secondary | ICD-10-CM

## 2019-08-31 NOTE — Progress Notes (Signed)
   Covid-19 Vaccination Clinic  Name:  Annette Key    MRN: [id number] DOB: 1940/08/31  08/31/2019  [name] was observed post Covid-19 immunization for 15 minutes without incidence. She was provided with Vaccine Information Sheet and instruction to access the V-Safe system.   Ms. Burn was instructed to call 911 with any severe reactions post vaccine: Marland Kitchen Difficulty breathing  . Swelling of your face and throat  . A fast heartbeat  . A bad rash all over your body  . Dizziness and weakness    Immunizations Administered    Name Date Dose VIS Date Route   Pfizer COVID-19 Vaccine 08/31/2019  9:45 AM 0.3 mL 06/28/2019 Intramuscular   Manufacturer: [place]   Lot: [id number]   [place]: [id number]

## 2019-09-06 DIAGNOSIS — F329 Major depressive disorder, single episode, unspecified: Secondary | ICD-10-CM | POA: Diagnosis not present

## 2019-09-06 DIAGNOSIS — F061 Catatonic disorder due to known physiological condition: Secondary | ICD-10-CM | POA: Diagnosis not present

## 2019-09-11 DIAGNOSIS — F061 Catatonic disorder due to known physiological condition: Secondary | ICD-10-CM | POA: Diagnosis not present

## 2019-09-11 DIAGNOSIS — F329 Major depressive disorder, single episode, unspecified: Secondary | ICD-10-CM | POA: Diagnosis not present

## 2019-09-13 DIAGNOSIS — F329 Major depressive disorder, single episode, unspecified: Secondary | ICD-10-CM | POA: Diagnosis not present

## 2019-09-13 DIAGNOSIS — F061 Catatonic disorder due to known physiological condition: Secondary | ICD-10-CM | POA: Diagnosis not present

## 2019-09-16 DIAGNOSIS — F061 Catatonic disorder due to known physiological condition: Secondary | ICD-10-CM | POA: Diagnosis not present

## 2019-09-16 DIAGNOSIS — F329 Major depressive disorder, single episode, unspecified: Secondary | ICD-10-CM | POA: Diagnosis not present

## 2019-10-07 DIAGNOSIS — F331 Major depressive disorder, recurrent, moderate: Secondary | ICD-10-CM | POA: Diagnosis not present

## 2019-10-07 DIAGNOSIS — F411 Generalized anxiety disorder: Secondary | ICD-10-CM | POA: Diagnosis not present

## 2019-10-25 DIAGNOSIS — F324 Major depressive disorder, single episode, in partial remission: Secondary | ICD-10-CM | POA: Diagnosis not present

## 2019-10-25 DIAGNOSIS — F411 Generalized anxiety disorder: Secondary | ICD-10-CM | POA: Diagnosis not present

## 2019-11-29 DIAGNOSIS — F411 Generalized anxiety disorder: Secondary | ICD-10-CM | POA: Diagnosis not present

## 2019-11-29 DIAGNOSIS — F331 Major depressive disorder, recurrent, moderate: Secondary | ICD-10-CM | POA: Diagnosis not present

## 2019-12-11 DIAGNOSIS — L820 Inflamed seborrheic keratosis: Secondary | ICD-10-CM | POA: Diagnosis not present

## 2019-12-11 DIAGNOSIS — D0439 Carcinoma in situ of skin of other parts of face: Secondary | ICD-10-CM | POA: Diagnosis not present

## 2020-01-02 DIAGNOSIS — Z85828 Personal history of other malignant neoplasm of skin: Secondary | ICD-10-CM | POA: Diagnosis not present

## 2020-01-02 DIAGNOSIS — C44329 Squamous cell carcinoma of skin of other parts of face: Secondary | ICD-10-CM | POA: Diagnosis not present

## 2020-01-09 DIAGNOSIS — Z0000 Encounter for general adult medical examination without abnormal findings: Secondary | ICD-10-CM | POA: Diagnosis not present

## 2020-01-09 DIAGNOSIS — Z23 Encounter for immunization: Secondary | ICD-10-CM | POA: Diagnosis not present

## 2020-01-09 DIAGNOSIS — I959 Hypotension, unspecified: Secondary | ICD-10-CM | POA: Diagnosis not present

## 2020-01-09 DIAGNOSIS — S22000D Wedge compression fracture of unspecified thoracic vertebra, subsequent encounter for fracture with routine healing: Secondary | ICD-10-CM | POA: Diagnosis not present

## 2020-01-09 DIAGNOSIS — K5900 Constipation, unspecified: Secondary | ICD-10-CM | POA: Diagnosis not present

## 2020-01-09 DIAGNOSIS — F321 Major depressive disorder, single episode, moderate: Secondary | ICD-10-CM | POA: Diagnosis not present

## 2020-01-09 DIAGNOSIS — F5105 Insomnia due to other mental disorder: Secondary | ICD-10-CM | POA: Diagnosis not present

## 2020-01-09 DIAGNOSIS — R7302 Impaired glucose tolerance (oral): Secondary | ICD-10-CM | POA: Diagnosis not present

## 2020-01-09 DIAGNOSIS — M810 Age-related osteoporosis without current pathological fracture: Secondary | ICD-10-CM | POA: Diagnosis not present

## 2020-01-09 DIAGNOSIS — R110 Nausea: Secondary | ICD-10-CM | POA: Diagnosis not present

## 2020-01-13 DIAGNOSIS — F411 Generalized anxiety disorder: Secondary | ICD-10-CM | POA: Diagnosis not present

## 2020-01-13 DIAGNOSIS — F324 Major depressive disorder, single episode, in partial remission: Secondary | ICD-10-CM | POA: Diagnosis not present

## 2020-01-14 DIAGNOSIS — I959 Hypotension, unspecified: Secondary | ICD-10-CM | POA: Diagnosis not present

## 2020-01-17 ENCOUNTER — Other Ambulatory Visit: Payer: Self-pay | Admitting: Family Medicine

## 2020-01-17 DIAGNOSIS — M810 Age-related osteoporosis without current pathological fracture: Secondary | ICD-10-CM

## 2020-01-17 DIAGNOSIS — Z1231 Encounter for screening mammogram for malignant neoplasm of breast: Secondary | ICD-10-CM

## 2020-01-17 DIAGNOSIS — S22050A Wedge compression fracture of T5-T6 vertebra, initial encounter for closed fracture: Secondary | ICD-10-CM

## 2020-02-11 DIAGNOSIS — B078 Other viral warts: Secondary | ICD-10-CM | POA: Diagnosis not present

## 2020-02-11 DIAGNOSIS — L570 Actinic keratosis: Secondary | ICD-10-CM | POA: Diagnosis not present

## 2020-02-11 DIAGNOSIS — Z85828 Personal history of other malignant neoplasm of skin: Secondary | ICD-10-CM | POA: Diagnosis not present

## 2020-02-15 DIAGNOSIS — F3342 Major depressive disorder, recurrent, in full remission: Secondary | ICD-10-CM | POA: Diagnosis not present

## 2020-03-27 DIAGNOSIS — D225 Melanocytic nevi of trunk: Secondary | ICD-10-CM | POA: Diagnosis not present

## 2020-03-27 DIAGNOSIS — D2271 Melanocytic nevi of right lower limb, including hip: Secondary | ICD-10-CM | POA: Diagnosis not present

## 2020-03-27 DIAGNOSIS — L570 Actinic keratosis: Secondary | ICD-10-CM | POA: Diagnosis not present

## 2020-03-27 DIAGNOSIS — Z85828 Personal history of other malignant neoplasm of skin: Secondary | ICD-10-CM | POA: Diagnosis not present

## 2020-04-10 ENCOUNTER — Other Ambulatory Visit: Payer: Self-pay

## 2020-04-10 ENCOUNTER — Ambulatory Visit
Admission: RE | Admit: 2020-04-10 | Discharge: 2020-04-10 | Disposition: A | Discharge status: Home / Self Care | Payer: PPO | Source: Ambulatory Visit | Attending: Family Medicine | Admitting: Family Medicine

## 2020-04-10 DIAGNOSIS — Z780 Asymptomatic menopausal state: Secondary | ICD-10-CM | POA: Diagnosis not present

## 2020-04-10 DIAGNOSIS — S22050A Wedge compression fracture of T5-T6 vertebra, initial encounter for closed fracture: Secondary | ICD-10-CM

## 2020-04-10 DIAGNOSIS — Z1231 Encounter for screening mammogram for malignant neoplasm of breast: Secondary | ICD-10-CM | POA: Diagnosis not present

## 2020-04-10 DIAGNOSIS — M810 Age-related osteoporosis without current pathological fracture: Secondary | ICD-10-CM | POA: Diagnosis not present

## 2020-04-16 ENCOUNTER — Ambulatory Visit (INDEPENDENT_AMBULATORY_CARE_PROVIDER_SITE_OTHER): Payer: PPO | Admitting: Cardiovascular Disease

## 2020-04-16 ENCOUNTER — Encounter: Payer: Self-pay | Admitting: Cardiovascular Disease

## 2020-04-16 ENCOUNTER — Other Ambulatory Visit: Payer: Self-pay

## 2020-04-16 DIAGNOSIS — I959 Hypotension, unspecified: Secondary | ICD-10-CM

## 2020-04-16 DIAGNOSIS — E861 Hypovolemia: Secondary | ICD-10-CM

## 2020-04-16 DIAGNOSIS — I9589 Other hypotension: Secondary | ICD-10-CM | POA: Diagnosis not present

## 2020-04-16 HISTORY — DX: Hypotension, unspecified: I95.9

## 2020-04-16 NOTE — Progress Notes (Signed)
Cardiology Office Note   Date:  04/16/2020   ID:  Annette Key, DOB 07-12-1941, MRN [id number]  PCP:  Derinda Late, MD  Cardiologist:   Skeet Latch, MD   No chief complaint on file.    History of Present Illness: Annette Key is a 79 y.o. female with depressionwho is being seen today for the evaluation of hypotension at the request of Shon Baton, MD.  She has noted episodes of low BP.  She went for her physical with Dr. Sandi Mariscal 02/2020 and had hypotension.  She checked her BP at home and it was running in the (90s/50-60s).  At the time she had been limiting her coffee intake in order to help with her anxiety.  Her anxiety initially increased after taking prednisone. She also reported a 19 lb weight loss and intermittent sharp pain due to lumbar compression fractures.  She had an EKG in the office that revealed sinus rhythm with LAD and was otherwise unremarkable.  She started eating more regularly, increasing her fluid and salt intake.  She is also drinking coffee again.  Lately her blood pressure has been 100s-120s/60s.  Ms. Mccurley has nausea on fluoxetine but it is controlling her anxiety. Lately her appetite has been good.  She started eating more regularly to combat the nausea and this seems to be helping.  She is drinking a lot of water and eating more salt. She has mild dizziness with orthostatic changes that last for a few moments.  She denies any syncope or presyncope.  She has no lower extremity edema, orthopnea or PND.  She walks for exercise daily.  She walks for one mile daily and has no exertional chest pain    Past Medical History:  Diagnosis Date  . Allergic rhinitis   . Hypotension 04/16/2020    History reviewed. No pertinent surgical history.   Current Outpatient Medications  Medication Sig Dispense Refill  . Cholecalciferol (VITAMIN D-3) 5000 UNITS TABS Take 1 tablet by mouth daily.    Marland Kitchen FLUoxetine (PROZAC) 10 MG tablet Take 10 mg by mouth every morning.     . Multiple Vitamin (MULTIVITAMIN) tablet Take 1 tablet by mouth daily.     No current facility-administered medications for this visit.    Allergies:   Sulfa antibiotics    Social History:  The patient  reports that she has never smoked. She has never used smokeless tobacco. She reports that she does not drink alcohol and does not use drugs.   Family History:  The patient's family history includes Cancer in her father; Heart attack in her brother; Hypertension in her mother; Pancreatic cancer in her sister; Throat cancer in her father.    ROS:  Please see the history of present illness.   Otherwise, review of systems are positive for none.   All other systems are reviewed and negative.   PHYSICAL EXAM: VS:  BP 118/70   Pulse 69   Temp (!) 97.1 F (36.2 C)   Ht 5' 3.5" (1.613 m)   Wt 150 lb (68 kg)   SpO2 98%   BMI 26.15 kg/m  , BMI Body mass index is 26.15 kg/m. GENERAL:  Well appearing HEENT:  Pupils equal round and reactive, fundi not visualized, oral mucosa unremarkable NECK:  No jugular venous distention, waveform within normal limits, carotid upstroke brisk and symmetric, no bruits, no thyromegaly LYMPHATICS:  No cervical adenopathy LUNGS:  Clear to auscultation bilaterally HEART:  RRR.  PMI not displaced or sustained,S1  and S2 within normal limits, no S3, no S4, no clicks, no rubs, no murmurs ABD:  Flat, positive bowel sounds normal in frequency in pitch, no bruits, no rebound, no guarding, no midline pulsatile mass, no hepatomegaly, no splenomegaly EXT:  2 plus pulses throughout, no edema, no cyanosis no clubbing SKIN:  No rashes no nodules NEURO:  Cranial nerves II through XII grossly intact, motor grossly intact throughout PSYCH:  Cognitively intact, oriented to person place and time    EKG:  EKG is ordered today. The ekg ordered today demonstrates sinus rhythm.  Rate 69 bpm.  LAD Low voltage.    Recent Labs: No results found for requested labs within last  8760 hours.   01/06/2020: WBC 6.9, hemoglobin 12.8, hematocrit 39.2, platelets 203 Sodium 140, potassium 4.7, BUN 25, creatinine 0.8 AST 19, ALT 11 TSH 2.81 Hemoglobin A1c was 5.6% Total cholesterol 193, triglycerides 48, HDL 81, LDL 98  Lipid Panel No results found for: CHOL, TRIG, HDL, CHOLHDL, VLDL, LDLCALC, LDLDIRECT    Wt Readings from Last 3 Encounters:  04/16/20 150 lb (68 kg)  12/13/18 131 lb (59.4 kg)  08/07/18 145 lb 3.2 oz (65.9 kg)      ASSESSMENT AND PLAN:  # Hypotension:  Symptoms have resolved.  Likely multifactorial in the setting of weight loss and limiting her caffeine intake.  She has no symptoms of heart failure.  She exercises without shortness of breath and I do not hear any valvular abnormalities on her exam.  She has already done the right things by increasing her salt intake and her fluids.  She is eating more regularly.  We did discuss wearing compression socks if her symptoms recur.  For now continue to monitor.  She has no evidence of parkinsonism or dysautonomia.   Current medicines are reviewed at length with the patient today.  The patient does not have concerns regarding medicines.  The following changes have been made:  no change  Labs/ tests ordered today include:  No orders of the defined types were placed in this encounter.    Disposition:   FU with Jameir Ake C. Oval Linsey, MD, [facility] as needed.     Signed, Samanda Buske C. Oval Linsey, MD, [facility]  04/16/2020 2:19 PM    [facility]

## 2020-04-16 NOTE — Patient Instructions (Signed)
Medication Instructions:  ?Your physician recommends that you continue on your current medications as directed. Please refer to the Current Medication list given to you today.  ? ?Labwork: ?NONE ? ?Testing/Procedures: ?NONE ? ?Follow-Up: ?AS NEEDED  ? ?  ?

## 2020-04-24 NOTE — Addendum Note (Signed)
Addended by: Hinton Dyer on: 04/24/2020 12:00 PM   Modules accepted: Orders

## 2020-05-25 DIAGNOSIS — Z23 Encounter for immunization: Secondary | ICD-10-CM | POA: Diagnosis not present

## 2021-02-10 IMAGING — MG DIGITAL SCREENING BILAT W/ TOMO W/ CAD
8 series · 8 of 24 positions shown · non-contrast
Comparison: Previous exam(s).

CLINICAL DATA: Screening.

EXAM:
DIGITAL SCREENING BILATERAL MAMMOGRAM WITH TOMO AND CAD

[R MLO synth-2D]
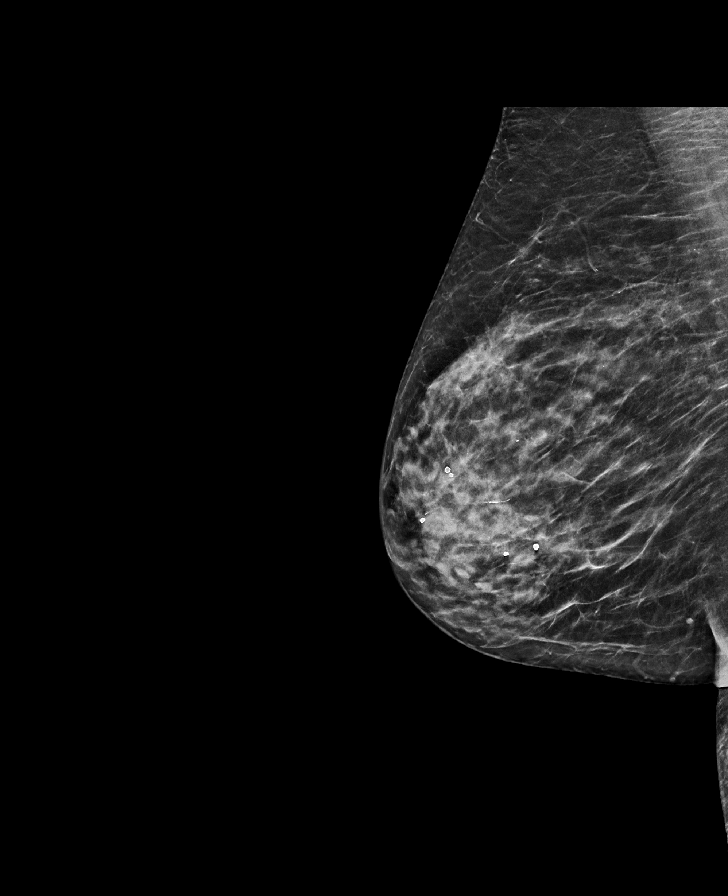

[L CC synth-2D]
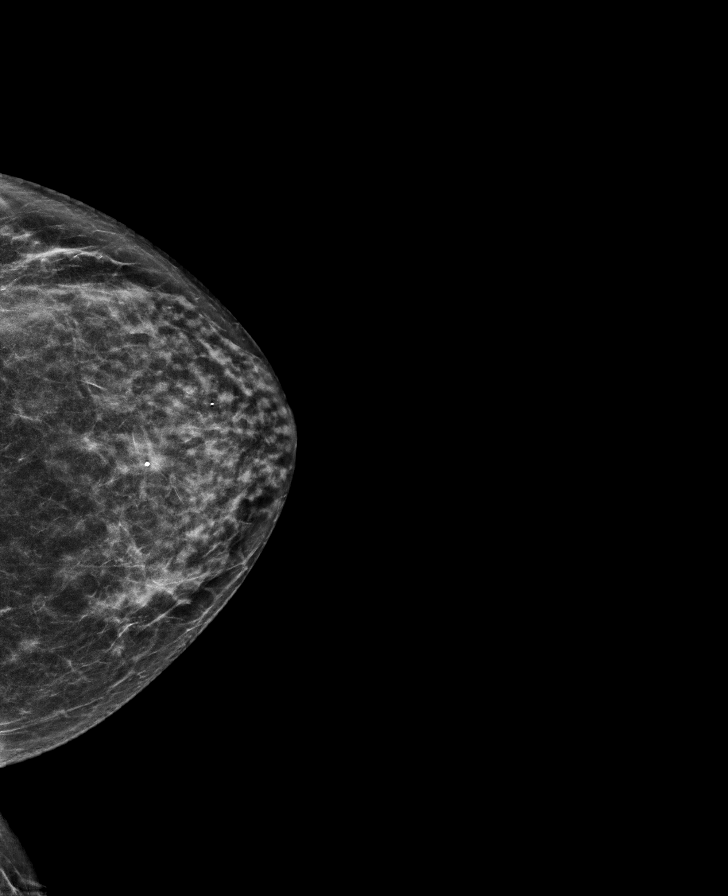

[R CC synth-2D]
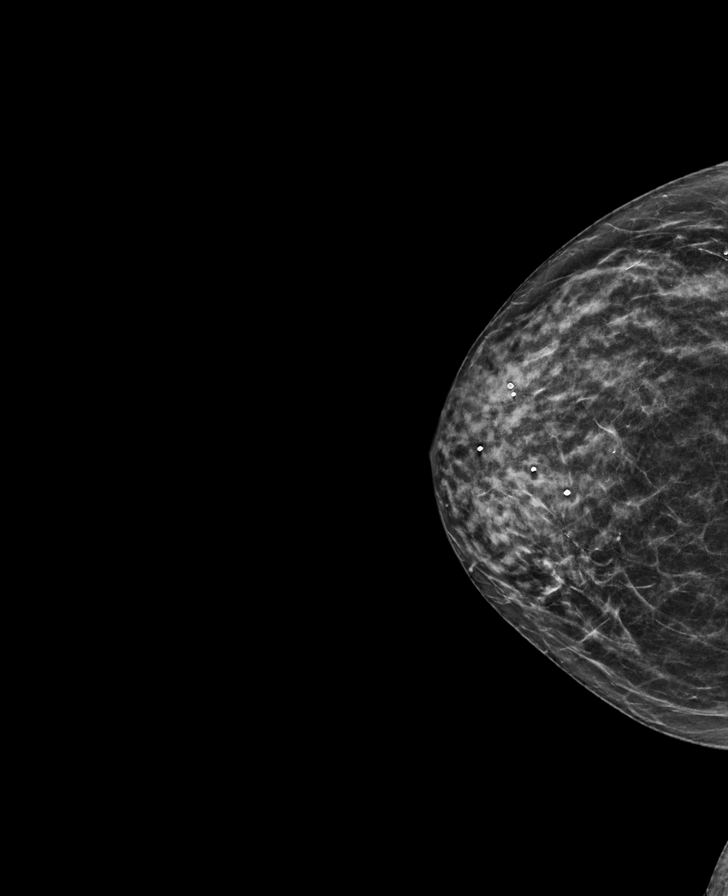

[L MLO synth-2D]
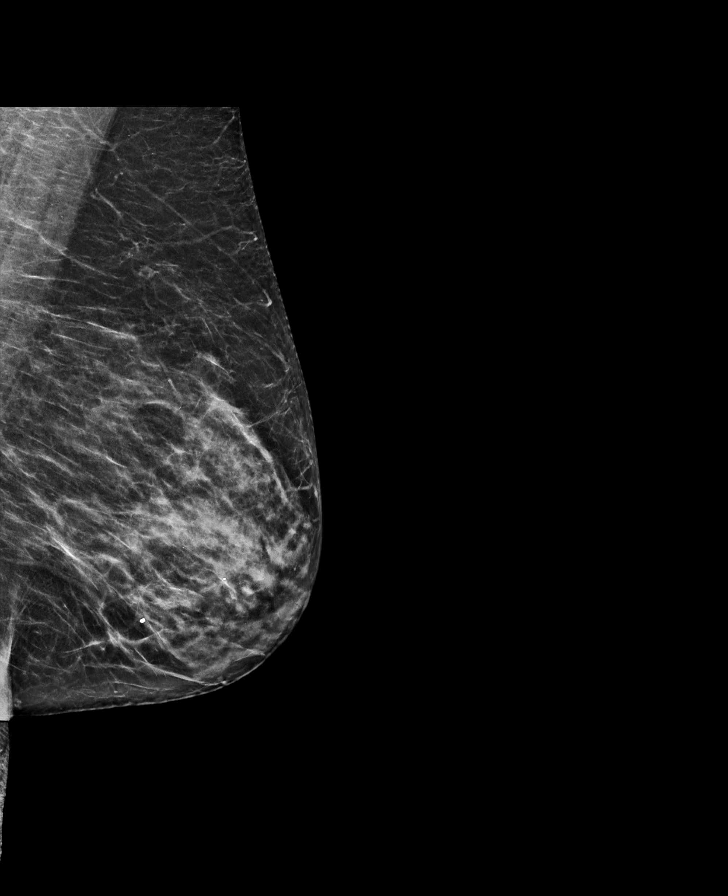

[L MLO tomo · tomo slice 35/68.0]
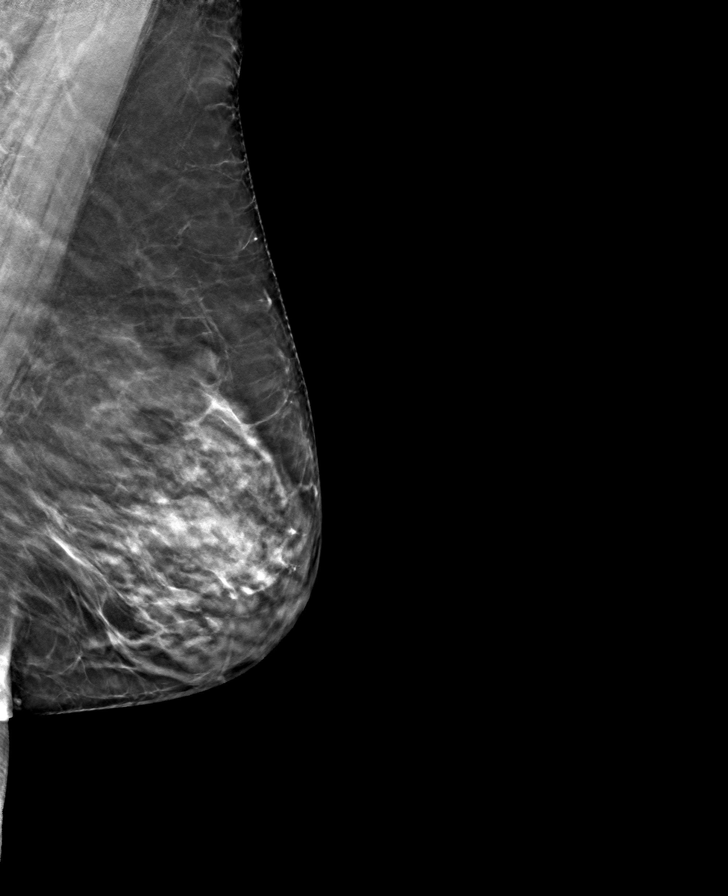

[L CC tomo · tomo slice 31/60.0]
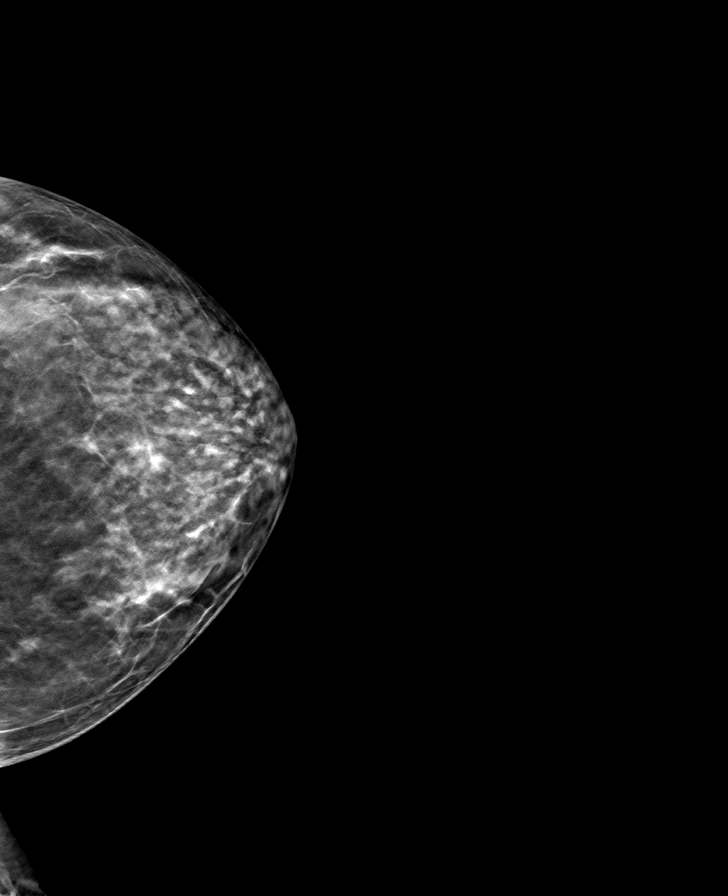

[R CC tomo · tomo slice 28/55.0]
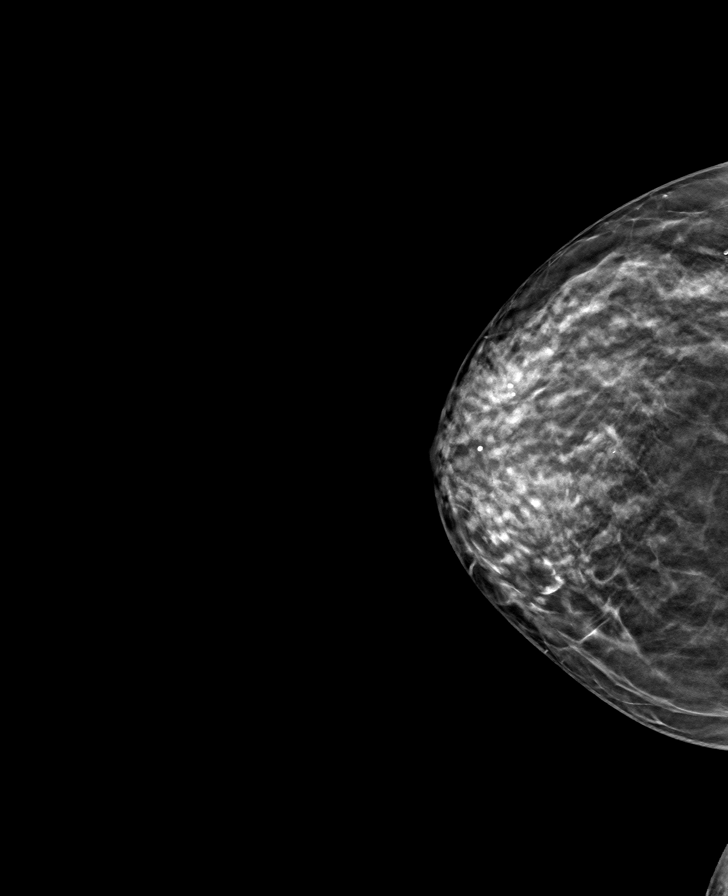

[R MLO tomo · tomo slice 33/65.0]
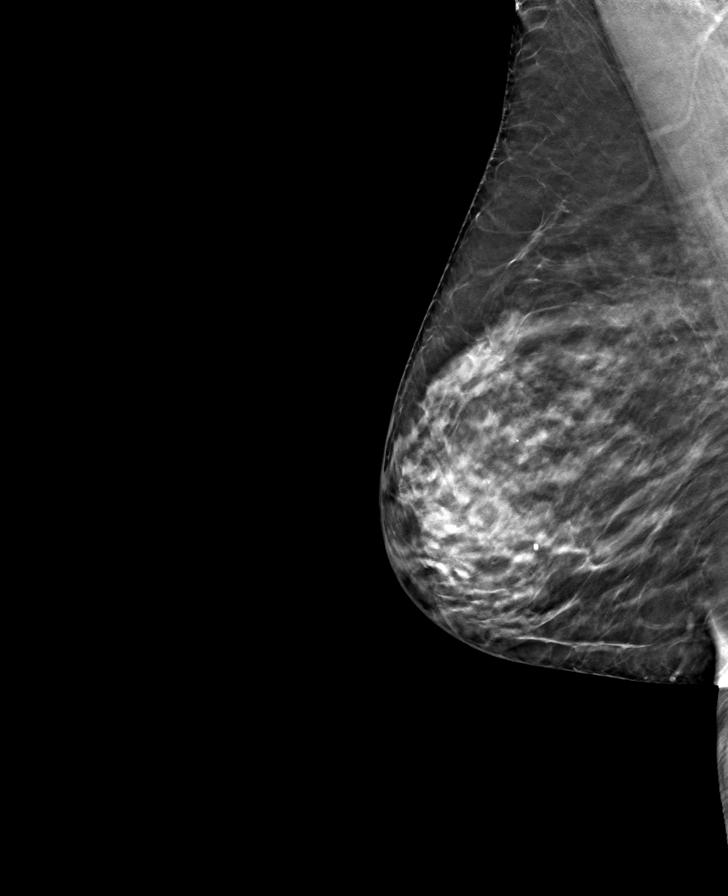

[8 of 24 positions shown; findings below may reference images not displayed]

ACR Breast Density Category c: The breast tissue is heterogeneously
dense, which may obscure small masses.
FINDINGS: There are no findings suspicious for malignancy. Images were
processed with CAD.
IMPRESSION: No mammographic evidence of malignancy. A result letter of this
screening mammogram will be mailed directly to the patient.

RECOMMENDATION:
Screening mammogram in one year. (Code:[id number])

BI-RADS CATEGORY  1: Negative.

## 2021-08-01 ENCOUNTER — Emergency Department (HOSPITAL_BASED_OUTPATIENT_CLINIC_OR_DEPARTMENT_OTHER): Payer: Medicare Other

## 2021-08-01 ENCOUNTER — Encounter (HOSPITAL_BASED_OUTPATIENT_CLINIC_OR_DEPARTMENT_OTHER): Payer: Self-pay | Admitting: Obstetrics and Gynecology

## 2021-08-01 ENCOUNTER — Inpatient Hospital Stay (HOSPITAL_BASED_OUTPATIENT_CLINIC_OR_DEPARTMENT_OTHER)
Admission: EM | Admit: 2021-08-01 | Discharge: 2021-08-04 | DRG: 435 | Disposition: A | Discharge status: Home / Self Care | Payer: Medicare Other | Attending: Internal Medicine | Admitting: Internal Medicine

## 2021-08-01 ENCOUNTER — Other Ambulatory Visit: Payer: Self-pay

## 2021-08-01 DIAGNOSIS — R7401 Elevation of levels of liver transaminase levels: Secondary | ICD-10-CM

## 2021-08-01 DIAGNOSIS — R933 Abnormal findings on diagnostic imaging of other parts of digestive tract: Secondary | ICD-10-CM

## 2021-08-01 DIAGNOSIS — E860 Dehydration: Secondary | ICD-10-CM | POA: Diagnosis not present

## 2021-08-01 DIAGNOSIS — C7801 Secondary malignant neoplasm of right lung: Secondary | ICD-10-CM | POA: Diagnosis not present

## 2021-08-01 DIAGNOSIS — Z808 Family history of malignant neoplasm of other organs or systems: Secondary | ICD-10-CM

## 2021-08-01 DIAGNOSIS — Z79899 Other long term (current) drug therapy: Secondary | ICD-10-CM

## 2021-08-01 DIAGNOSIS — R17 Unspecified jaundice: Secondary | ICD-10-CM

## 2021-08-01 DIAGNOSIS — K219 Gastro-esophageal reflux disease without esophagitis: Secondary | ICD-10-CM | POA: Diagnosis not present

## 2021-08-01 DIAGNOSIS — Z8249 Family history of ischemic heart disease and other diseases of the circulatory system: Secondary | ICD-10-CM

## 2021-08-01 DIAGNOSIS — K831 Obstruction of bile duct: Secondary | ICD-10-CM | POA: Diagnosis present

## 2021-08-01 DIAGNOSIS — E871 Hypo-osmolality and hyponatremia: Secondary | ICD-10-CM

## 2021-08-01 DIAGNOSIS — K8689 Other specified diseases of pancreas: Secondary | ICD-10-CM

## 2021-08-01 DIAGNOSIS — C250 Malignant neoplasm of head of pancreas: Secondary | ICD-10-CM | POA: Diagnosis not present

## 2021-08-01 DIAGNOSIS — R1011 Right upper quadrant pain: Secondary | ICD-10-CM

## 2021-08-01 DIAGNOSIS — Z800 Family history of malignant neoplasm of digestive organs: Secondary | ICD-10-CM

## 2021-08-01 DIAGNOSIS — Z20822 Contact with and (suspected) exposure to covid-19: Secondary | ICD-10-CM | POA: Diagnosis not present

## 2021-08-01 LAB — CBC
HCT: 42.7 % (ref 36.0–46.0)
Hemoglobin: 14.2 g/dL (ref 12.0–15.0)
MCH: 29.6 pg (ref 26.0–34.0)
MCHC: 33.3 g/dL (ref 30.0–36.0)
MCV: 89 fL (ref 80.0–100.0)
Platelets: 289 10*3/uL (ref 150–400)
RBC: 4.8 MIL/uL (ref 3.87–5.11)
RDW: 15 % (ref 11.5–15.5)
WBC: 9.2 10*3/uL (ref 4.0–10.5)
nRBC: 0 % (ref 0.0–0.2)

## 2021-08-01 LAB — HEPATIC FUNCTION PANEL
ALT: 1361 U/L — ABNORMAL HIGH (ref 0–44)
AST: 571 U/L — ABNORMAL HIGH (ref 15–41)
Albumin: 4.2 g/dL (ref 3.5–5.0)
Alkaline Phosphatase: 380 U/L — ABNORMAL HIGH (ref 38–126)
Bilirubin, Direct: 7.4 mg/dL — ABNORMAL HIGH (ref 0.0–0.2)
Indirect Bilirubin: 5.2 mg/dL — ABNORMAL HIGH (ref 0.3–0.9)
Total Bilirubin: 12.6 mg/dL — ABNORMAL HIGH (ref 0.3–1.2)
Total Protein: 7.1 g/dL (ref 6.5–8.1)

## 2021-08-01 LAB — LIPASE, BLOOD: Lipase: 26 U/L (ref 11–51)

## 2021-08-01 LAB — URINALYSIS, ROUTINE W REFLEX MICROSCOPIC
Glucose, UA: NEGATIVE mg/dL
Hgb urine dipstick: NEGATIVE
Leukocytes,Ua: NEGATIVE
Nitrite: NEGATIVE
Specific Gravity, Urine: >1.046 — ABNORMAL HIGH (ref 1.005–1.030)
pH: 6 (ref 5.0–8.0)

## 2021-08-01 LAB — BASIC METABOLIC PANEL
Anion gap: 12 (ref 5–15)
BUN: 16 mg/dL (ref 8–23)
CO2: 25 mmol/L (ref 22–32)
Calcium: 9.6 mg/dL (ref 8.9–10.3)
Chloride: 95 mmol/L — ABNORMAL LOW (ref 98–111)
Creatinine, Ser: 0.86 mg/dL (ref 0.44–1.00)
GFR, Estimated: >60 mL/min (ref >60)
Glucose, Bld: 135 mg/dL — ABNORMAL HIGH (ref 70–99)
Potassium: 4 mmol/L (ref 3.5–5.1)
Sodium: 132 mmol/L — ABNORMAL LOW (ref 135–145)

## 2021-08-01 LAB — RESP PANEL BY RT-PCR (FLU A&B, COVID) ARPGX2
Influenza A by PCR: NEGATIVE
Influenza B by PCR: NEGATIVE
SARS Coronavirus 2 by RT PCR: NEGATIVE

## 2021-08-01 LAB — PROTIME-INR
INR: 1 (ref 0.8–1.2)
Prothrombin Time: 12.7 seconds (ref 11.4–15.2)

## 2021-08-01 MED ORDER — ONDANSETRON HCL 4 MG PO TABS
4.0000 mg | ORAL_TABLET | Freq: Four times a day (QID) | ORAL | Status: DC | PRN
  Administered 2021-08-04: 4 mg via ORAL
  Filled 2021-08-01: qty 1

## 2021-08-01 MED ORDER — LACTATED RINGERS IV SOLN: INTRAVENOUS | Status: DC

## 2021-08-01 MED ORDER — ONDANSETRON HCL 4 MG/2ML IJ SOLN
4.0000 mg | Freq: Four times a day (QID) | INTRAMUSCULAR | Status: DC | PRN
  Administered 2021-08-03: 4 mg via INTRAVENOUS
  Filled 2021-08-01 (×2): qty 2

## 2021-08-01 MED ORDER — ENOXAPARIN SODIUM 40 MG/0.4ML IJ SOSY
40.0000 mg | PREFILLED_SYRINGE | INTRAMUSCULAR | Status: DC
  Administered 2021-08-01: 40 mg via SUBCUTANEOUS
  Filled 2021-08-01: qty 0.4

## 2021-08-01 MED ORDER — IOHEXOL 300 MG/ML  SOLN
100.0000 mL | Freq: Once | INTRAMUSCULAR | Status: AC | PRN
  Administered 2021-08-01: 100 mL via INTRAVENOUS

## 2021-08-01 MED ORDER — KETOROLAC TROMETHAMINE 15 MG/ML IJ SOLN
15.0000 mg | Freq: Three times a day (TID) | INTRAMUSCULAR | Status: DC | PRN
  Administered 2021-08-02: 15 mg via INTRAVENOUS
  Filled 2021-08-01 (×2): qty 1

## 2021-08-01 NOTE — ED Provider Notes (Addendum)
81 yo here here abdominal nausea LFT elevated  RUQ ultrasound concerning for biliary obstruction Subsequent CT abdomen pelvis showed pancreatic mass  Patient reassessed - pain is minimal, she remains jaundiced. Patient and husband updated about diagnosis and need for hospitalization and in agreement  Patient admitted 7 pm to hospitalist for [facility] transfer and admission. Pending MRI pancreas on admission  Clinical Course as of 08/02/21 0941  Sun Aug 01, 2021  1724 I spoke to Dr Stacie Glaze GI who recommends transfer to [facility] for MRI of the pancreas to evaluate mass; team will consult on patient after admission.   Paged for admission [MT]    Clinical Course User Index [MT] [place] Brogden, Carola Rhine, MD      Wyvonnia Dusky, MD 08/01/21 Drema Halon    Wyvonnia Dusky, MD 08/02/21 [phone]

## 2021-08-01 NOTE — H&P (Signed)
History and Physical    Annette Key OEV:[id number] DOB: [birth year] DOA: 08/01/2021  PCP: Derinda Late, MD   Patient coming from: Home  Chief Complaint: Pearline Cables stool, nausea, fatigue, abdominal pain  HPI: Annette Key is a 81 y.o. female with no significant past medical history presents with complaint of abdominal pain, gray stools, fatigue and jaundice.  She reports that for the last few weeks she has been having intermittent mid upper and right upper quadrant abdominal pain that has progressively worsened.  She reports she has a decreased appetite and decreased energy level over the last week.  Her husband noticed her skin today being very yellow and with her abdominal pain brought her to the emergency room.  She has been having nausea for the past week or 2.  She states that her stools became gray to white which concerned her.  She has not had any abdominal trauma.  She denies any fever, chills, chest pain, shortness of breath, cough, loss of consciousness.  No recent travel.  No history of DVTs or PEs.  She has not had any change in her diet in the past few months.   ED Course: In the emergency room Ms. Apuzzo has been hemodynamically stable.  Found to be significantly jaundiced with elevated LFTs.  Ultrasound showed biliary dilatation with a questionable mass in the pancreatic head.  CT of the abdomen confirmed biliary dilatation but cannot characterize the pancreas well.  MRCP was recommended.  ER physician discussed with gastroenterology who will see patient in the morning.  Lab work reveals normal CBC, sodium 132 potassium 4.0 chloride 95 bicarb 25 creatinine 0.86 BUN 16 calcium 9.6 albumin 4.2 glucose 135 alkaline phosphatase 380 AST 571 ALT 1361 bilirubin 12.6 with direct bilirubin of 7.4 and indirect bilirubin 5.2, lipase 26.  INR 1.0.  Urinalysis trace ketones otherwise negative.  COVID-19 negative.  Influenza A and B are negative.  Hospitalist service asked to admit for further  management  Review of Systems:  General: Reports fatigue. Reports decreased appetite. Denies fever, chills, weight loss, night sweats. Denies dizziness.  HENT: Denies head trauma, headache, denies tinnitus.  Denies nasal congestion or bleeding.  Denies sore throat.  Denies difficulty swallowing Eyes: Denies blurry vision, pain in eye, drainage.  Denies discoloration of eyes. Neck: Denies pain.  Denies swelling.  Denies pain with movement. Cardiovascular: Denies chest pain, palpitations.  Denies edema.  Denies orthopnea Respiratory: Denies shortness of breath, cough.  Denies wheezing.  Denies sputum production Gastrointestinal: Reports abdominal pain. Reports gray stool. Denies swelling.  Denies vomiting, diarrhea. Denies melena.  Denies hematemesis. Musculoskeletal: Denies limitation of movement.  Denies deformity or swelling. Denies arthralgias or myalgias. Genitourinary: Denies pelvic pain.  Denies urinary frequency or hesitancy.  Denies dysuria.  Skin: Reports jaundice. Denies rash.  Denies petechiae, purpura, ecchymosis. Neurological: Denies syncope. Denies seizure activity. Denies paresthesia. Denies slurred speech, drooping face.  Denies visual change. Psychiatric: Denies depression, anxiety. Denies hallucinations.  Past Medical History:  Diagnosis Date   Allergic rhinitis    Hypotension 04/16/2020    History reviewed. No pertinent surgical history.  Social History  reports that she has never smoked. She has never used smokeless tobacco. She reports that she does not drink alcohol and does not use drugs.  Allergies  Allergen Reactions   Sulfa Antibiotics Palpitations    Family History  Problem Relation Age of Onset   Hypertension Mother    Throat cancer Father    Cancer Father  Pancreatic cancer Sister    Heart attack Brother    Breast cancer Neg Hx      Prior to Admission medications   Medication Sig Start Date End Date Taking? Authorizing Provider   Cholecalciferol (VITAMIN D-3) 5000 UNITS TABS Take 1 tablet by mouth daily.    [provider]  FLUoxetine (PROZAC) 10 MG tablet Take 10 mg by mouth every morning. 02/20/20   [provider]  Multiple Vitamin (MULTIVITAMIN) tablet Take 1 tablet by mouth daily.    [provider]    Physical Exam: Vitals:   08/01/21 1530 08/01/21 1730 08/01/21 1800 08/01/21 2129  BP: (!) 149/138 115/79 (!) 147/84 126/65  Pulse: 85 87 81 79  Resp:  16 18 16   Temp:    98.2 F (36.8 C)  TempSrc:    Oral  SpO2: 99% 98% 99% 97%    Constitutional: NAD, calm, comfortable Vitals:   08/01/21 1530 08/01/21 1730 08/01/21 1800 08/01/21 2129  BP: (!) 149/138 115/79 (!) 147/84 126/65  Pulse: 85 87 81 79  Resp:  16 18 16   Temp:    98.2 F (36.8 C)  TempSrc:    Oral  SpO2: 99% 98% 99% 97%   General: WDWN, Alert and oriented x3.  Eyes: EOMI, PERRL, conjunctivae normal.  Sclera icteric HENT:  [place]/AT, external ears normal.  Nares patent without epistasis.  Mucous membranes are dry.  Neck: Soft, normal range of motion, supple, no masses, no thyromegaly.  Trachea midline Respiratory: clear to auscultation bilaterally, no wheezing, no crackles. Normal respiratory effort. No accessory muscle use.  Cardiovascular: Regular rate and rhythm, no murmurs / rubs / gallops. No extremity edema. 2+ pedal pulses. Abdomen: Soft, RUQ and epigastric tenderness, nondistended, no rebound or guarding.  No masses palpated. No hepatosplenomegaly. Bowel sounds hypoactive Musculoskeletal: FROM. no cyanosis. No joint deformity upper and lower extremities. Normal muscle tone.  Skin: Jaundice present. Warm, dry, intact no rashes, No purpura. Neurologic: CN 2-12 grossly intact. Normal speech. Sensation intact to touch, patella DTR +1 bilaterally. Strength 5/5 in all extremities.   Psychiatric: Normal judgment. Normal mood.    Labs on Admission: I have personally reviewed following labs and imaging  studies  CBC: Recent Labs  Lab 08/01/21 1444  WBC 9.2  HGB 14.2  HCT 42.7  MCV 89.0  PLT 338    Basic Metabolic Panel: Recent Labs  Lab 08/01/21 1433  NA 132*  K 4.0  CL 95*  CO2 25  GLUCOSE 135*  BUN 16  CREATININE 0.86  CALCIUM 9.6    GFR: CrCl cannot be calculated (Unknown ideal weight.).  Liver Function Tests: Recent Labs  Lab 08/01/21 1433  AST 571*  ALT 1,361*  ALKPHOS 380*  BILITOT 12.6*  PROT 7.1  ALBUMIN 4.2    Urine analysis:    Component Value Date/Time   COLORURINE YELLOW 08/01/2021 [place] 08/01/2021 1428   LABSPEC >1.046 (H) 08/01/2021 1428   PHURINE 6.0 08/01/2021 1428   GLUCOSEU NEGATIVE 08/01/2021 1428   HGBUR NEGATIVE 08/01/2021 1428   BILIRUBINUR MODERATE (A) 08/01/2021 1428   KETONESUR TRACE (A) 08/01/2021 1428   PROTEINUR TRACE (A) 08/01/2021 1428   NITRITE NEGATIVE 08/01/2021 1428   LEUKOCYTESUR NEGATIVE 08/01/2021 1428    Radiological Exams on Admission: CT ABDOMEN PELVIS W CONTRAST  Result Date: 08/01/2021 CLINICAL DATA:  Nausea vomiting. EXAM: CT ABDOMEN AND PELVIS WITH CONTRAST TECHNIQUE: Multidetector CT imaging of the abdomen and pelvis was performed using the standard protocol  following bolus administration of intravenous contrast. RADIATION DOSE REDUCTION: This exam was performed according to the departmental dose-optimization program which includes automated exposure control, adjustment of the mA and/or kV according to patient size and/or use of iterative reconstruction technique. CONTRAST:  168mL OMNIPAQUE IOHEXOL 300 MG/ML  SOLN COMPARISON:  None. FINDINGS: Lower chest: Solid pulmonary nodules versus areas of consolidation in the right lower lobe, the largest measuring 1.5 cm. Multiple smaller solid subpleural pulmonary nodules in the left lung base. Hepatobiliary: Diffuse intrahepatic biliary ductal dilation. Normal appearance of the gallbladder. Mild distention of the proximal common bile duct. The  distal common bile duct is difficult to visualize. Pancreas: Diffuse cystic dilation of the main pancreatic duct involving the body and tail of the pancreas. Bulbous heterogeneous appearance of the head of the pancreas. Spleen: Normal in size without focal abnormality. Adrenals/Urinary Tract: Adrenal glands are unremarkable. Kidneys are normal, without renal calculi, focal lesion, or hydronephrosis. Bladder is unremarkable. Stomach/Bowel: Stomach is within normal limits. Appendix appears normal. No evidence of bowel wall thickening, distention, or inflammatory changes. Vascular/Lymphatic: Aortic atherosclerosis. No enlarged abdominal or pelvic lymph nodes. 9 mm short axis lymph node in the porta hepatic is noted. Reproductive: Uterus and bilateral adnexa are unremarkable. Other: No abdominal wall hernia or abnormality. No abdominopelvic ascites. Musculoskeletal: No acute or significant osseous findings. IMPRESSION: 1. Diffuse intrahepatic biliary ductal dilation with mild distention of the proximal common bile duct. The distal common bile duct is difficult to visualize. Bulbous heterogeneous appearance of the head of the pancreas. Cystic dilation of the main pancreatic duct in the body and head of the pancreas. Further evaluation with pancreatic mass protocol MRI of the abdomen may be considered. 2. Solid pulmonary nodules versus areas of consolidation in the right lower lobe, the largest measuring 1.5 cm. Multiple smaller solid subpleural pulmonary nodules in the left lung base. Metastatic disease cannot be excluded. Further evaluation with complete chest CT, or PET-CT if the imaging of the abdomen is positive for malignancy, may be considered. 3. 9 mm short axis lymph node in the porta hepatic region, indeterminate. 4. Aortic atherosclerosis. Aortic Atherosclerosis (ICD10-I70.0). Electronically Signed   By: Fidela Salisbury M.D.   On: 08/01/2021 16:35   US Abdomen Limited RUQ (LIVER/GB)  Result Date:  08/01/2021 CLINICAL DATA:  Jaundice. Itching skin. Generalized abdominal pain for 6 days. EXAM: ULTRASOUND ABDOMEN LIMITED RIGHT UPPER QUADRANT COMPARISON:  05/07/2004. FINDINGS: Gallbladder: Distended, but with no stones or wall thickening or pericholecystic fluid. There is a small amount of gallbladder sludge and the patient is tender over the gallbladder to transducer pressure. Common bile duct: Diameter: 7 mm.  Distal duct not visualized. Liver: Normal in size and overall echogenicity. No mass. Mild central intrahepatic bile duct dilation. Portal vein is patent on color Doppler imaging with normal direction of blood flow towards the liver. Other: None. IMPRESSION: 1. Common bile duct measures 7 mm and there is mild central intrahepatic bile duct dilation. The distal common bile duct is not well visualized. Cannot exclude a distal duct stone or other obstructing lesion. Given the history of jaundice, consider follow-up MRCP/ERCP. 2. Distended gallbladder, no stones and no convincing acute cholecystitis. Electronically Signed   By: Lajean Manes M.D.   On: 08/01/2021 16:01    Assessment/Plan Principal Problem:   Biliary obstruction Ms. Rovira is observed at [facility]  GI consult in am IVF hydration with LR at 100 ml/hr MRCP to evaluate pancreas and biliary system in more detail.  Definitive  plan can be made after the biliary and pancreatic anatomy is better evaluated  Active Problems:   Transaminitis LFTs elevated due to biliary obstruction. Monitor with CMP in am.     Pancreatic mass MRCP ordered for further evaluation GI consulted and will see in am     Jaundice Due to biliary obstruction.  IVF hydration with LR at 100 ml/hr Check CMP in am    RUQ pain Secondary to pancreatic mass and biliary obstruction. MRCP ordered.     Hyponatremia Mild. Secondary to dehydration with poor po intake recently vs possible SIADH IVF hydration     Pulmonary nodule Multiple nodules in  the right base of lung. Metastatic disease is a concern. If MRCP positive for pancreatic mass then will need Chest CT vs PET-CT to further evaluate.   DVT prophylaxis: Lovenox for DVT prophylaxis.   Code Status:   Full Code  Family Communication:  Diagnosis and plan discussed with patient and her husband.  Questions answered.  They verbalized understanding and agree with plan.  Further recommendations to follow as clinically indicated Disposition Plan:   Patient is from:  Home  Anticipated DC to:  Home  Anticipated DC date:  Anticipate less than 2 midnight stay but this could change depending on workup  Time spent:   45 minutes  Consults called:  Gastroenterology consulted by ER physician  Admission status:  Observation  Eben Burow MD [facility]  How to contact the [facility] Attending or Consulting provider [place] or covering provider during after hours [place], for this patient?   Check the care team in [facility] and look for a) attending/consulting [facility] provider listed and b) the [facility] team listed Log into [email or web address] and use 's universal password to access. If you do not have the password, please contact the hospital operator. Locate the [facility] provider you are looking for under [facility] and page to a number that you can be directly reached. If you still have difficulty reaching the provider, please page the [facility] (Director on Call) for the Hospitalists listed on amion for assistance.  08/01/2021, 9:41 PM

## 2021-08-01 NOTE — ED Triage Notes (Signed)
Patient reports she believes she has a bile duct obstruction as she has itchiness, gnawing feeling in her gut, and a light grey colored stools.

## 2021-08-02 ENCOUNTER — Observation Stay (HOSPITAL_COMMUNITY): Payer: Medicare Other

## 2021-08-02 DIAGNOSIS — Z808 Family history of malignant neoplasm of other organs or systems: Secondary | ICD-10-CM | POA: Diagnosis not present

## 2021-08-02 DIAGNOSIS — K831 Obstruction of bile duct: Secondary | ICD-10-CM | POA: Diagnosis present

## 2021-08-02 DIAGNOSIS — E860 Dehydration: Secondary | ICD-10-CM | POA: Diagnosis present

## 2021-08-02 DIAGNOSIS — Z79899 Other long term (current) drug therapy: Secondary | ICD-10-CM | POA: Diagnosis not present

## 2021-08-02 DIAGNOSIS — K219 Gastro-esophageal reflux disease without esophagitis: Secondary | ICD-10-CM | POA: Diagnosis present

## 2021-08-02 DIAGNOSIS — C250 Malignant neoplasm of head of pancreas: Secondary | ICD-10-CM | POA: Diagnosis present

## 2021-08-02 DIAGNOSIS — Z800 Family history of malignant neoplasm of digestive organs: Secondary | ICD-10-CM | POA: Diagnosis not present

## 2021-08-02 DIAGNOSIS — R17 Unspecified jaundice: Secondary | ICD-10-CM | POA: Diagnosis present

## 2021-08-02 DIAGNOSIS — Z8249 Family history of ischemic heart disease and other diseases of the circulatory system: Secondary | ICD-10-CM | POA: Diagnosis not present

## 2021-08-02 DIAGNOSIS — E871 Hypo-osmolality and hyponatremia: Secondary | ICD-10-CM | POA: Diagnosis present

## 2021-08-02 DIAGNOSIS — C7801 Secondary malignant neoplasm of right lung: Secondary | ICD-10-CM | POA: Diagnosis present

## 2021-08-02 DIAGNOSIS — K8689 Other specified diseases of pancreas: Secondary | ICD-10-CM | POA: Diagnosis not present

## 2021-08-02 DIAGNOSIS — Z20822 Contact with and (suspected) exposure to covid-19: Secondary | ICD-10-CM | POA: Diagnosis present

## 2021-08-02 LAB — COMPREHENSIVE METABOLIC PANEL
ALT: 1130 U/L — ABNORMAL HIGH (ref 0–44)
AST: 521 U/L — ABNORMAL HIGH (ref 15–41)
Albumin: 3.1 g/dL — ABNORMAL LOW (ref 3.5–5.0)
Alkaline Phosphatase: 278 U/L — ABNORMAL HIGH (ref 38–126)
Anion gap: 8 (ref 5–15)
BUN: 14 mg/dL (ref 8–23)
CO2: 26 mmol/L (ref 22–32)
Calcium: 8.8 mg/dL — ABNORMAL LOW (ref 8.9–10.3)
Chloride: 100 mmol/L (ref 98–111)
Creatinine, Ser: 0.62 mg/dL (ref 0.44–1.00)
GFR, Estimated: >60 mL/min (ref >60)
Glucose, Bld: 112 mg/dL — ABNORMAL HIGH (ref 70–99)
Potassium: 4.3 mmol/L (ref 3.5–5.1)
Sodium: 134 mmol/L — ABNORMAL LOW (ref 135–145)
Total Bilirubin: 10.2 mg/dL — ABNORMAL HIGH (ref 0.3–1.2)
Total Protein: 6.3 g/dL — ABNORMAL LOW (ref 6.5–8.1)

## 2021-08-02 LAB — CBC
HCT: 38 % (ref 36.0–46.0)
Hemoglobin: 12.4 g/dL (ref 12.0–15.0)
MCH: 29.7 pg (ref 26.0–34.0)
MCHC: 32.6 g/dL (ref 30.0–36.0)
MCV: 90.9 fL (ref 80.0–100.0)
Platelets: 237 10*3/uL (ref 150–400)
RBC: 4.18 MIL/uL (ref 3.87–5.11)
RDW: 15.2 % (ref 11.5–15.5)
WBC: 7.2 10*3/uL (ref 4.0–10.5)
nRBC: 0 % (ref 0.0–0.2)

## 2021-08-02 MED ORDER — KETOROLAC TROMETHAMINE 15 MG/ML IJ SOLN: 15.0000 mg | Freq: Three times a day (TID) | INTRAMUSCULAR | Status: AC | PRN

## 2021-08-02 MED ORDER — OXYCODONE HCL 5 MG PO TABS
5.0000 mg | ORAL_TABLET | ORAL | Status: DC | PRN
  Administered 2021-08-02 (×2): 5 mg via ORAL
  Filled 2021-08-02 (×2): qty 1

## 2021-08-02 MED ORDER — GADOBUTROL 1 MMOL/ML IV SOLN
7.0000 mL | Freq: Once | INTRAVENOUS | Status: AC | PRN
  Administered 2021-08-02: 7 mL via INTRAVENOUS

## 2021-08-02 MED ORDER — SODIUM CHLORIDE 0.9 % IV SOLN: INTRAVENOUS | Status: DC

## 2021-08-02 MED ORDER — IOHEXOL 300 MG/ML  SOLN
75.0000 mL | Freq: Once | INTRAMUSCULAR | Status: AC | PRN
  Administered 2021-08-02: 75 mL via INTRAVENOUS

## 2021-08-02 NOTE — Plan of Care (Signed)
°  Problem: Coping: Goal: Level of anxiety will decrease Outcome: Progressing   Problem: Clinical Measurements: Goal: Respiratory complications will improve Outcome: Not Applicable

## 2021-08-02 NOTE — H&P (View-Only) (Signed)
Reason for Consult: Obstructive jaundice Referring Physician: [facility]  Annette Key HPI: This is an 81 year old female without a significant PMH admitted for obstructive jaundice.  Her symptoms started a few weeks ago with fatigue, nausea, and abdominal pain.  Her symptoms progressed as she noticed that her stools were gray and she started to become jaundiced.  Her urine color darkened a couple of weeks ago.  Her sister died from pancreatic cancer in her 23's.  Work up in the ER showed that she has a 3.5 x 2.0 cm pancreatic head mass with biliary ductal dilation was well as PD dilation.  There was evidence of involvement of the celiac axis and pulmonary mets on the MRCP.  Her chest CT was positive for innumerable mets in the lungs.  Her blood work is consistent with a biliary obstructive process with marked elevations in her liver enzymes and TB.  Past Medical History:  Diagnosis Date   Allergic rhinitis    Hypotension 04/16/2020    History reviewed. No pertinent surgical history.  Family History  Problem Relation Age of Onset   Hypertension Mother    Throat cancer Father    Cancer Father    Pancreatic cancer Sister    Heart attack Brother    Breast cancer Neg Hx     Social History:  reports that she has never smoked. She has never used smokeless tobacco. She reports that she does not drink alcohol and does not use drugs.  Allergies:  Allergies  Allergen Reactions   Sulfa Antibiotics Palpitations    Medications: Scheduled: Continuous:  lactated ringers 100 mL/hr at 08/02/21 0944    Results for orders placed or performed during the hospital encounter of 08/01/21 (from the past 24 hour(s))  Urinalysis, Routine w reflex microscopic Urine, Clean Catch     Status: Abnormal   Collection Time: 08/01/21  2:28 PM  Result Value Ref Range   Color, Urine YELLOW YELLOW   APPearance CLEAR CLEAR   Specific Gravity, Urine >1.046 (H) 1.005 - 1.030   pH 6.0 5.0 - 8.0   Glucose,  UA NEGATIVE NEGATIVE mg/dL   Hgb urine dipstick NEGATIVE NEGATIVE   Bilirubin Urine MODERATE (A) NEGATIVE   Ketones, ur TRACE (A) NEGATIVE mg/dL   Protein, ur TRACE (A) NEGATIVE mg/dL   Nitrite NEGATIVE NEGATIVE   Leukocytes,Ua NEGATIVE NEGATIVE  Basic metabolic panel     Status: Abnormal   Collection Time: 08/01/21  2:33 PM  Result Value Ref Range   Sodium 132 (L) 135 - 145 mmol/L   Potassium 4.0 3.5 - 5.1 mmol/L   Chloride 95 (L) 98 - 111 mmol/L   CO2 25 22 - 32 mmol/L   Glucose, Bld 135 (H) 70 - 99 mg/dL   BUN 16 8 - 23 mg/dL   Creatinine, Ser 0.86 0.44 - 1.00 mg/dL   Calcium 9.6 8.9 - 10.3 mg/dL   GFR, Estimated >60 >60 mL/min   Anion gap 12 5 - 15  Hepatic function panel     Status: Abnormal   Collection Time: 08/01/21  2:33 PM  Result Value Ref Range   Total Protein 7.1 6.5 - 8.1 g/dL   Albumin 4.2 3.5 - 5.0 g/dL   AST 571 (H) 15 - 41 U/L   ALT 1,361 (H) 0 - 44 U/L   Alkaline Phosphatase 380 (H) 38 - 126 U/L   Total Bilirubin 12.6 (H) 0.3 - 1.2 mg/dL   Bilirubin, Direct 7.4 (H) 0.0 - 0.2  mg/dL   Indirect Bilirubin 5.2 (H) 0.3 - 0.9 mg/dL  Lipase, blood     Status: None   Collection Time: 08/01/21  2:44 PM  Result Value Ref Range   Lipase 26 11 - 51 U/L  CBC     Status: None   Collection Time: 08/01/21  2:44 PM  Result Value Ref Range   WBC 9.2 4.0 - 10.5 K/uL   RBC 4.80 3.87 - 5.11 MIL/uL   Hemoglobin 14.2 12.0 - 15.0 g/dL   HCT 42.7 36.0 - 46.0 %   MCV 89.0 80.0 - 100.0 fL   MCH 29.6 26.0 - 34.0 pg   MCHC 33.3 30.0 - 36.0 g/dL   RDW 15.0 11.5 - 15.5 %   Platelets 289 150 - 400 K/uL   nRBC 0.0 0.0 - 0.2 %  Protime-INR     Status: None   Collection Time: 08/01/21  2:44 PM  Result Value Ref Range   Prothrombin Time 12.7 11.4 - 15.2 seconds   INR 1.0 0.8 - 1.2  Resp Panel by RT-PCR (Flu A&B, Covid) Nasopharyngeal Swab     Status: None   Collection Time: 08/01/21  3:24 PM   Specimen: Nasopharyngeal Swab; Nasopharyngeal(NP) swabs in vial transport medium   Result Value Ref Range   SARS Coronavirus 2 by RT PCR NEGATIVE NEGATIVE   Influenza A by PCR NEGATIVE NEGATIVE   Influenza B by PCR NEGATIVE NEGATIVE  Comprehensive metabolic panel     Status: Abnormal   Collection Time: 08/02/21  5:10 AM  Result Value Ref Range   Sodium 134 (L) 135 - 145 mmol/L   Potassium 4.3 3.5 - 5.1 mmol/L   Chloride 100 98 - 111 mmol/L   CO2 26 22 - 32 mmol/L   Glucose, Bld 112 (H) 70 - 99 mg/dL   BUN 14 8 - 23 mg/dL   Creatinine, Ser 0.62 0.44 - 1.00 mg/dL   Calcium 8.8 (L) 8.9 - 10.3 mg/dL   Total Protein 6.3 (L) 6.5 - 8.1 g/dL   Albumin 3.1 (L) 3.5 - 5.0 g/dL   AST 521 (H) 15 - 41 U/L   ALT 1,130 (H) 0 - 44 U/L   Alkaline Phosphatase 278 (H) 38 - 126 U/L   Total Bilirubin 10.2 (H) 0.3 - 1.2 mg/dL   GFR, Estimated >60 >60 mL/min   Anion gap 8 5 - 15  CBC     Status: None   Collection Time: 08/02/21  5:10 AM  Result Value Ref Range   WBC 7.2 4.0 - 10.5 K/uL   RBC 4.18 3.87 - 5.11 MIL/uL   Hemoglobin 12.4 12.0 - 15.0 g/dL   HCT 38.0 36.0 - 46.0 %   MCV 90.9 80.0 - 100.0 fL   MCH 29.7 26.0 - 34.0 pg   MCHC 32.6 30.0 - 36.0 g/dL   RDW 15.2 11.5 - 15.5 %   Platelets 237 150 - 400 K/uL   nRBC 0.0 0.0 - 0.2 %     CT CHEST W CONTRAST  Result Date: 08/02/2021 CLINICAL DATA:  Presumed new diagnosis pancreatic cancer staging, pulmonary nodules EXAM: CT CHEST WITH CONTRAST TECHNIQUE: Multidetector CT imaging of the chest was performed during intravenous contrast administration. RADIATION DOSE REDUCTION: This exam was performed according to the departmental dose-optimization program which includes automated exposure control, adjustment of the mA and/or kV according to patient size and/or use of iterative reconstruction technique. CONTRAST:  9mL OMNIPAQUE IOHEXOL 300 MG/ML  SOLN COMPARISON:  CT abdomen pelvis,  08/01/2021 FINDINGS: Cardiovascular: Aortic atherosclerosis. Normal heart size. Scattered coronary artery calcifications. No pericardial effusion.  Mediastinum/Nodes: No enlarged mediastinal, hilar, or axillary lymph nodes. Thyroid gland, trachea, and esophagus demonstrate no significant findings. Lungs/Pleura: Innumerable bilateral pulmonary nodules of varying sizes, largest in the right lung base measuring 1.5 x 1.4 cm (series 5, image 111). Additional index nodule of the posterior right apex measures 1.3 x 1.2 cm (series 5, image 33). No pleural effusion or pneumothorax. Upper Abdomen: No acute abnormality. Biliary and pancreatic ductal dilatation partially imaged masslike lesion of the pancreatic head better assessed by prior dedicated examination of the abdomen. Musculoskeletal: No chest wall abnormality. No suspicious osseous lesions identified. IMPRESSION: 1. Innumerable bilateral pulmonary nodules of varying sizes, consistent with metastatic disease. 2. Biliary and pancreatic ductal dilatation as well as partially imaged masslike lesion of the pancreatic head, better assessed by prior dedicated examinations of the abdomen. 3. Coronary artery disease. Aortic Atherosclerosis (ICD10-I70.0). Electronically Signed   By: Delanna Ahmadi M.D.   On: 08/02/2021 13:10   CT ABDOMEN PELVIS W CONTRAST  Result Date: 08/01/2021 CLINICAL DATA:  Nausea vomiting. EXAM: CT ABDOMEN AND PELVIS WITH CONTRAST TECHNIQUE: Multidetector CT imaging of the abdomen and pelvis was performed using the standard protocol following bolus administration of intravenous contrast. RADIATION DOSE REDUCTION: This exam was performed according to the departmental dose-optimization program which includes automated exposure control, adjustment of the mA and/or kV according to patient size and/or use of iterative reconstruction technique. CONTRAST:  146mL OMNIPAQUE IOHEXOL 300 MG/ML  SOLN COMPARISON:  None. FINDINGS: Lower chest: Solid pulmonary nodules versus areas of consolidation in the right lower lobe, the largest measuring 1.5 cm. Multiple smaller solid subpleural pulmonary nodules in the  left lung base. Hepatobiliary: Diffuse intrahepatic biliary ductal dilation. Normal appearance of the gallbladder. Mild distention of the proximal common bile duct. The distal common bile duct is difficult to visualize. Pancreas: Diffuse cystic dilation of the main pancreatic duct involving the body and tail of the pancreas. Bulbous heterogeneous appearance of the head of the pancreas. Spleen: Normal in size without focal abnormality. Adrenals/Urinary Tract: Adrenal glands are unremarkable. Kidneys are normal, without renal calculi, focal lesion, or hydronephrosis. Bladder is unremarkable. Stomach/Bowel: Stomach is within normal limits. Appendix appears normal. No evidence of bowel wall thickening, distention, or inflammatory changes. Vascular/Lymphatic: Aortic atherosclerosis. No enlarged abdominal or pelvic lymph nodes. 9 mm short axis lymph node in the porta hepatic is noted. Reproductive: Uterus and bilateral adnexa are unremarkable. Other: No abdominal wall hernia or abnormality. No abdominopelvic ascites. Musculoskeletal: No acute or significant osseous findings. IMPRESSION: 1. Diffuse intrahepatic biliary ductal dilation with mild distention of the proximal common bile duct. The distal common bile duct is difficult to visualize. Bulbous heterogeneous appearance of the head of the pancreas. Cystic dilation of the main pancreatic duct in the body and head of the pancreas. Further evaluation with pancreatic mass protocol MRI of the abdomen may be considered. 2. Solid pulmonary nodules versus areas of consolidation in the right lower lobe, the largest measuring 1.5 cm. Multiple smaller solid subpleural pulmonary nodules in the left lung base. Metastatic disease cannot be excluded. Further evaluation with complete chest CT, or PET-CT if the imaging of the abdomen is positive for malignancy, may be considered. 3. 9 mm short axis lymph node in the porta hepatic region, indeterminate. 4. Aortic atherosclerosis.  Aortic Atherosclerosis (ICD10-I70.0). Electronically Signed   By: Fidela Salisbury M.D.   On: 08/01/2021 16:35   MR 3D Recon At  Scanner  Result Date: 08/02/2021 CLINICAL DATA:  Nausea, vomiting, biliary ductal dilatation, pancreatic ductal dilatation, possible pancreatic head mass EXAM: MRI ABDOMEN WITHOUT AND WITH CONTRAST (INCLUDING MRCP) TECHNIQUE: Multiplanar multisequence MR imaging of the abdomen was performed both before and after the administration of intravenous contrast. Heavily T2-weighted images of the biliary and pancreatic ducts were obtained, and three-dimensional MRCP images were rendered by post processing. CONTRAST:  36mL GADAVIST GADOBUTROL 1 MMOL/ML IV SOLN COMPARISON:  CT abdomen pelvis, 08/01/2021 FINDINGS: Examination is generally limited by breath motion artifact throughout Lower chest: Numerous pulmonary nodules throughout the included bilateral lung bases, poorly evaluated by MR. Hepatobiliary: No mass or other parenchymal abnormality identified. No gallstones. Moderate intra and extrahepatic biliary ductal dilatation, the common bile duct measuring up to 0.9 cm. The common bile duct is abruptly truncated in the superior pancreatic head. Pancreas: Masslike, slightly hypoenhancing appearance of the pancreatic head, measuring approximately 3.5 x 2.2 cm (series 3, image 20), with abrupt truncation of the pancreatic duct in the superior pancreatic head, the pancreatic duct dilated distally up to 0.7 cm, with atrophy of the distal pancreatic parenchyma.No pancreatic ductal dilatation. Spleen:  Within normal limits in size and appearance. Adrenals/Urinary Tract: Normal adrenal glands. No renal masses or suspicious contrast enhancement identified. No evidence of hydronephrosis. Stomach/Bowel: Visualized portions within the abdomen are unremarkable. Vascular/Lymphatic: No discretely enlarged lymph nodes identified. There is abnormal contrast enhancing soft tissue about the celiac axis (series  21, image 39). No abdominal aortic aneurysm demonstrated. Breath motion artifact significantly limits evaluation of the vessels in the vicinity of suspected pancreatic head mass; particularly the superior mesenteric vein, central splenic vein, and portal vein are not meaningfully assessed on this examination but were patent on prior CT dated 08/01/2021. Other:  None. Musculoskeletal: No suspicious osseous lesions identified. IMPRESSION: 1. Masslike, slightly hypoenhancing appearance of the pancreatic head, measuring approximately 3.5 x 2.2 cm, with abrupt truncation of the common bile duct and pancreatic duct in the superior pancreatic head, highly suspicious for pancreatic adenocarcinoma. 2. There are no discretely enlarged lymph nodes, however there is abnormal contrast enhancing soft tissue about the celiac axis, suspicious for nodal metastatic disease. 3. Numerous pulmonary nodules throughout the included bilateral lung bases, poorly evaluated by MR, better seen by prior CT, however these remain highly suspicious for pulmonary metastases. 4. Due to breath motion artifact, the vascular structures, particularly the superior mesenteric, central splenic, and portal veins are poorly assessed, but were patent on prior CT. 5. Due to significant limitations of breath motion artifact on this examination, consider contrast enhanced CT for future follow-up and restaging imaging by CT. Electronically Signed   By: Delanna Ahmadi M.D.   On: 08/02/2021 09:35   MR ABDOMEN MRCP W WO CONTAST  Result Date: 08/02/2021 CLINICAL DATA:  Nausea, vomiting, biliary ductal dilatation, pancreatic ductal dilatation, possible pancreatic head mass EXAM: MRI ABDOMEN WITHOUT AND WITH CONTRAST (INCLUDING MRCP) TECHNIQUE: Multiplanar multisequence MR imaging of the abdomen was performed both before and after the administration of intravenous contrast. Heavily T2-weighted images of the biliary and pancreatic ducts were obtained, and  three-dimensional MRCP images were rendered by post processing. CONTRAST:  35mL GADAVIST GADOBUTROL 1 MMOL/ML IV SOLN COMPARISON:  CT abdomen pelvis, 08/01/2021 FINDINGS: Examination is generally limited by breath motion artifact throughout Lower chest: Numerous pulmonary nodules throughout the included bilateral lung bases, poorly evaluated by MR. Hepatobiliary: No mass or other parenchymal abnormality identified. No gallstones. Moderate intra and extrahepatic biliary ductal dilatation, the common bile  duct measuring up to 0.9 cm. The common bile duct is abruptly truncated in the superior pancreatic head. Pancreas: Masslike, slightly hypoenhancing appearance of the pancreatic head, measuring approximately 3.5 x 2.2 cm (series 3, image 20), with abrupt truncation of the pancreatic duct in the superior pancreatic head, the pancreatic duct dilated distally up to 0.7 cm, with atrophy of the distal pancreatic parenchyma.No pancreatic ductal dilatation. Spleen:  Within normal limits in size and appearance. Adrenals/Urinary Tract: Normal adrenal glands. No renal masses or suspicious contrast enhancement identified. No evidence of hydronephrosis. Stomach/Bowel: Visualized portions within the abdomen are unremarkable. Vascular/Lymphatic: No discretely enlarged lymph nodes identified. There is abnormal contrast enhancing soft tissue about the celiac axis (series 21, image 39). No abdominal aortic aneurysm demonstrated. Breath motion artifact significantly limits evaluation of the vessels in the vicinity of suspected pancreatic head mass; particularly the superior mesenteric vein, central splenic vein, and portal vein are not meaningfully assessed on this examination but were patent on prior CT dated 08/01/2021. Other:  None. Musculoskeletal: No suspicious osseous lesions identified. IMPRESSION: 1. Masslike, slightly hypoenhancing appearance of the pancreatic head, measuring approximately 3.5 x 2.2 cm, with abrupt truncation  of the common bile duct and pancreatic duct in the superior pancreatic head, highly suspicious for pancreatic adenocarcinoma. 2. There are no discretely enlarged lymph nodes, however there is abnormal contrast enhancing soft tissue about the celiac axis, suspicious for nodal metastatic disease. 3. Numerous pulmonary nodules throughout the included bilateral lung bases, poorly evaluated by MR, better seen by prior CT, however these remain highly suspicious for pulmonary metastases. 4. Due to breath motion artifact, the vascular structures, particularly the superior mesenteric, central splenic, and portal veins are poorly assessed, but were patent on prior CT. 5. Due to significant limitations of breath motion artifact on this examination, consider contrast enhanced CT for future follow-up and restaging imaging by CT. Electronically Signed   By: Delanna Ahmadi M.D.   On: 08/02/2021 09:35   US Abdomen Limited RUQ (LIVER/GB)  Result Date: 08/01/2021 CLINICAL DATA:  Jaundice. Itching skin. Generalized abdominal pain for 6 days. EXAM: ULTRASOUND ABDOMEN LIMITED RIGHT UPPER QUADRANT COMPARISON:  05/07/2004. FINDINGS: Gallbladder: Distended, but with no stones or wall thickening or pericholecystic fluid. There is a small amount of gallbladder sludge and the patient is tender over the gallbladder to transducer pressure. Common bile duct: Diameter: 7 mm.  Distal duct not visualized. Liver: Normal in size and overall echogenicity. No mass. Mild central intrahepatic bile duct dilation. Portal vein is patent on color Doppler imaging with normal direction of blood flow towards the liver. Other: None. IMPRESSION: 1. Common bile duct measures 7 mm and there is mild central intrahepatic bile duct dilation. The distal common bile duct is not well visualized. Cannot exclude a distal duct stone or other obstructing lesion. Given the history of jaundice, consider follow-up MRCP/ERCP. 2. Distended gallbladder, no stones and no  convincing acute cholecystitis. Electronically Signed   By: Lajean Manes M.D.   On: 08/01/2021 16:01    ROS:  As stated above in the HPI otherwise negative.  Blood pressure (!) 145/72, pulse 79, temperature 98.3 F (36.8 C), temperature source Oral, resp. rate 16, SpO2 93 %.    PE: Gen: NAD, Alert and Oriented, jaundiced HEENT:  [place]/AT, EOMI, scleral icterus Neck: Supple, no LAD Lungs: CTA Bilaterally CV: RRR without M/G/R ABD: Soft, NTND, +BS Ext: No C/C/E  Assessment/Plan: 1) Pancreatic head mass. 2) Obstructive jaundice. 3) Family history of pancreatic cancer.  The patient's clinical presentation is consistent with pancreatic cancer.  She does have a significant obstruction and an ERCP with stent placement is necessary.  Plan: 1) ERCP with stent placement tomorrow. 2) ? Utility of performing an EUS with FNA for metastatic disease. 3) Check CA19-9.  Deira Shimer D 08/02/2021, 1:51 PM

## 2021-08-02 NOTE — Consult Note (Signed)
Reason for Consult: Obstructive jaundice Referring Physician: [facility]  Delbert Harness HPI: This is an 81 year old female without a significant PMH admitted for obstructive jaundice.  Her symptoms started a few weeks ago with fatigue, nausea, and abdominal pain.  Her symptoms progressed as she noticed that her stools were gray and she started to become jaundiced.  Her urine color darkened a couple of weeks ago.  Her sister died from pancreatic cancer in her 41's.  Work up in the ER showed that she has a 3.5 x 2.0 cm pancreatic head mass with biliary ductal dilation was well as PD dilation.  There was evidence of involvement of the celiac axis and pulmonary mets on the MRCP.  Her chest CT was positive for innumerable mets in the lungs.  Her blood work is consistent with a biliary obstructive process with marked elevations in her liver enzymes and TB.  Past Medical History:  Diagnosis Date   Allergic rhinitis    Hypotension 04/16/2020    History reviewed. No pertinent surgical history.  Family History  Problem Relation Age of Onset   Hypertension Mother    Throat cancer Father    Cancer Father    Pancreatic cancer Sister    Heart attack Brother    Breast cancer Neg Hx     Social History:  reports that she has never smoked. She has never used smokeless tobacco. She reports that she does not drink alcohol and does not use drugs.  Allergies:  Allergies  Allergen Reactions   Sulfa Antibiotics Palpitations    Medications: Scheduled: Continuous:  lactated ringers 100 mL/hr at 08/02/21 0944    Results for orders placed or performed during the hospital encounter of 08/01/21 (from the past 24 hour(s))  Urinalysis, Routine w reflex microscopic Urine, Clean Catch     Status: Abnormal   Collection Time: 08/01/21  2:28 PM  Result Value Ref Range   Color, Urine YELLOW YELLOW   APPearance CLEAR CLEAR   Specific Gravity, Urine >1.046 (H) 1.005 - 1.030   pH 6.0 5.0 - 8.0   Glucose,  UA NEGATIVE NEGATIVE mg/dL   Hgb urine dipstick NEGATIVE NEGATIVE   Bilirubin Urine MODERATE (A) NEGATIVE   Ketones, ur TRACE (A) NEGATIVE mg/dL   Protein, ur TRACE (A) NEGATIVE mg/dL   Nitrite NEGATIVE NEGATIVE   Leukocytes,Ua NEGATIVE NEGATIVE  Basic metabolic panel     Status: Abnormal   Collection Time: 08/01/21  2:33 PM  Result Value Ref Range   Sodium 132 (L) 135 - 145 mmol/L   Potassium 4.0 3.5 - 5.1 mmol/L   Chloride 95 (L) 98 - 111 mmol/L   CO2 25 22 - 32 mmol/L   Glucose, Bld 135 (H) 70 - 99 mg/dL   BUN 16 8 - 23 mg/dL   Creatinine, Ser 0.86 0.44 - 1.00 mg/dL   Calcium 9.6 8.9 - 10.3 mg/dL   GFR, Estimated >60 >60 mL/min   Anion gap 12 5 - 15  Hepatic function panel     Status: Abnormal   Collection Time: 08/01/21  2:33 PM  Result Value Ref Range   Total Protein 7.1 6.5 - 8.1 g/dL   Albumin 4.2 3.5 - 5.0 g/dL   AST 571 (H) 15 - 41 U/L   ALT 1,361 (H) 0 - 44 U/L   Alkaline Phosphatase 380 (H) 38 - 126 U/L   Total Bilirubin 12.6 (H) 0.3 - 1.2 mg/dL   Bilirubin, Direct 7.4 (H) 0.0 - 0.2  mg/dL   Indirect Bilirubin 5.2 (H) 0.3 - 0.9 mg/dL  Lipase, blood     Status: None   Collection Time: 08/01/21  2:44 PM  Result Value Ref Range   Lipase 26 11 - 51 U/L  CBC     Status: None   Collection Time: 08/01/21  2:44 PM  Result Value Ref Range   WBC 9.2 4.0 - 10.5 K/uL   RBC 4.80 3.87 - 5.11 MIL/uL   Hemoglobin 14.2 12.0 - 15.0 g/dL   HCT 42.7 36.0 - 46.0 %   MCV 89.0 80.0 - 100.0 fL   MCH 29.6 26.0 - 34.0 pg   MCHC 33.3 30.0 - 36.0 g/dL   RDW 15.0 11.5 - 15.5 %   Platelets 289 150 - 400 K/uL   nRBC 0.0 0.0 - 0.2 %  Protime-INR     Status: None   Collection Time: 08/01/21  2:44 PM  Result Value Ref Range   Prothrombin Time 12.7 11.4 - 15.2 seconds   INR 1.0 0.8 - 1.2  Resp Panel by RT-PCR (Flu A&B, Covid) Nasopharyngeal Swab     Status: None   Collection Time: 08/01/21  3:24 PM   Specimen: Nasopharyngeal Swab; Nasopharyngeal(NP) swabs in vial transport medium   Result Value Ref Range   SARS Coronavirus 2 by RT PCR NEGATIVE NEGATIVE   Influenza A by PCR NEGATIVE NEGATIVE   Influenza B by PCR NEGATIVE NEGATIVE  Comprehensive metabolic panel     Status: Abnormal   Collection Time: 08/02/21  5:10 AM  Result Value Ref Range   Sodium 134 (L) 135 - 145 mmol/L   Potassium 4.3 3.5 - 5.1 mmol/L   Chloride 100 98 - 111 mmol/L   CO2 26 22 - 32 mmol/L   Glucose, Bld 112 (H) 70 - 99 mg/dL   BUN 14 8 - 23 mg/dL   Creatinine, Ser 0.62 0.44 - 1.00 mg/dL   Calcium 8.8 (L) 8.9 - 10.3 mg/dL   Total Protein 6.3 (L) 6.5 - 8.1 g/dL   Albumin 3.1 (L) 3.5 - 5.0 g/dL   AST 521 (H) 15 - 41 U/L   ALT 1,130 (H) 0 - 44 U/L   Alkaline Phosphatase 278 (H) 38 - 126 U/L   Total Bilirubin 10.2 (H) 0.3 - 1.2 mg/dL   GFR, Estimated >60 >60 mL/min   Anion gap 8 5 - 15  CBC     Status: None   Collection Time: 08/02/21  5:10 AM  Result Value Ref Range   WBC 7.2 4.0 - 10.5 K/uL   RBC 4.18 3.87 - 5.11 MIL/uL   Hemoglobin 12.4 12.0 - 15.0 g/dL   HCT 38.0 36.0 - 46.0 %   MCV 90.9 80.0 - 100.0 fL   MCH 29.7 26.0 - 34.0 pg   MCHC 32.6 30.0 - 36.0 g/dL   RDW 15.2 11.5 - 15.5 %   Platelets 237 150 - 400 K/uL   nRBC 0.0 0.0 - 0.2 %     CT CHEST W CONTRAST  Result Date: 08/02/2021 CLINICAL DATA:  Presumed new diagnosis pancreatic cancer staging, pulmonary nodules EXAM: CT CHEST WITH CONTRAST TECHNIQUE: Multidetector CT imaging of the chest was performed during intravenous contrast administration. RADIATION DOSE REDUCTION: This exam was performed according to the departmental dose-optimization program which includes automated exposure control, adjustment of the mA and/or kV according to patient size and/or use of iterative reconstruction technique. CONTRAST:  19mL OMNIPAQUE IOHEXOL 300 MG/ML  SOLN COMPARISON:  CT abdomen pelvis,  08/01/2021 FINDINGS: Cardiovascular: Aortic atherosclerosis. Normal heart size. Scattered coronary artery calcifications. No pericardial effusion.  Mediastinum/Nodes: No enlarged mediastinal, hilar, or axillary lymph nodes. Thyroid gland, trachea, and esophagus demonstrate no significant findings. Lungs/Pleura: Innumerable bilateral pulmonary nodules of varying sizes, largest in the right lung base measuring 1.5 x 1.4 cm (series 5, image 111). Additional index nodule of the posterior right apex measures 1.3 x 1.2 cm (series 5, image 33). No pleural effusion or pneumothorax. Upper Abdomen: No acute abnormality. Biliary and pancreatic ductal dilatation partially imaged masslike lesion of the pancreatic head better assessed by prior dedicated examination of the abdomen. Musculoskeletal: No chest wall abnormality. No suspicious osseous lesions identified. IMPRESSION: 1. Innumerable bilateral pulmonary nodules of varying sizes, consistent with metastatic disease. 2. Biliary and pancreatic ductal dilatation as well as partially imaged masslike lesion of the pancreatic head, better assessed by prior dedicated examinations of the abdomen. 3. Coronary artery disease. Aortic Atherosclerosis (ICD10-I70.0). Electronically Signed   By: Delanna Ahmadi M.D.   On: 08/02/2021 13:10   CT ABDOMEN PELVIS W CONTRAST  Result Date: 08/01/2021 CLINICAL DATA:  Nausea vomiting. EXAM: CT ABDOMEN AND PELVIS WITH CONTRAST TECHNIQUE: Multidetector CT imaging of the abdomen and pelvis was performed using the standard protocol following bolus administration of intravenous contrast. RADIATION DOSE REDUCTION: This exam was performed according to the departmental dose-optimization program which includes automated exposure control, adjustment of the mA and/or kV according to patient size and/or use of iterative reconstruction technique. CONTRAST:  198mL OMNIPAQUE IOHEXOL 300 MG/ML  SOLN COMPARISON:  None. FINDINGS: Lower chest: Solid pulmonary nodules versus areas of consolidation in the right lower lobe, the largest measuring 1.5 cm. Multiple smaller solid subpleural pulmonary nodules in the  left lung base. Hepatobiliary: Diffuse intrahepatic biliary ductal dilation. Normal appearance of the gallbladder. Mild distention of the proximal common bile duct. The distal common bile duct is difficult to visualize. Pancreas: Diffuse cystic dilation of the main pancreatic duct involving the body and tail of the pancreas. Bulbous heterogeneous appearance of the head of the pancreas. Spleen: Normal in size without focal abnormality. Adrenals/Urinary Tract: Adrenal glands are unremarkable. Kidneys are normal, without renal calculi, focal lesion, or hydronephrosis. Bladder is unremarkable. Stomach/Bowel: Stomach is within normal limits. Appendix appears normal. No evidence of bowel wall thickening, distention, or inflammatory changes. Vascular/Lymphatic: Aortic atherosclerosis. No enlarged abdominal or pelvic lymph nodes. 9 mm short axis lymph node in the porta hepatic is noted. Reproductive: Uterus and bilateral adnexa are unremarkable. Other: No abdominal wall hernia or abnormality. No abdominopelvic ascites. Musculoskeletal: No acute or significant osseous findings. IMPRESSION: 1. Diffuse intrahepatic biliary ductal dilation with mild distention of the proximal common bile duct. The distal common bile duct is difficult to visualize. Bulbous heterogeneous appearance of the head of the pancreas. Cystic dilation of the main pancreatic duct in the body and head of the pancreas. Further evaluation with pancreatic mass protocol MRI of the abdomen may be considered. 2. Solid pulmonary nodules versus areas of consolidation in the right lower lobe, the largest measuring 1.5 cm. Multiple smaller solid subpleural pulmonary nodules in the left lung base. Metastatic disease cannot be excluded. Further evaluation with complete chest CT, or PET-CT if the imaging of the abdomen is positive for malignancy, may be considered. 3. 9 mm short axis lymph node in the porta hepatic region, indeterminate. 4. Aortic atherosclerosis.  Aortic Atherosclerosis (ICD10-I70.0). Electronically Signed   By: Fidela Salisbury M.D.   On: 08/01/2021 16:35   MR 3D Recon At  Scanner  Result Date: 08/02/2021 CLINICAL DATA:  Nausea, vomiting, biliary ductal dilatation, pancreatic ductal dilatation, possible pancreatic head mass EXAM: MRI ABDOMEN WITHOUT AND WITH CONTRAST (INCLUDING MRCP) TECHNIQUE: Multiplanar multisequence MR imaging of the abdomen was performed both before and after the administration of intravenous contrast. Heavily T2-weighted images of the biliary and pancreatic ducts were obtained, and three-dimensional MRCP images were rendered by post processing. CONTRAST:  15mL GADAVIST GADOBUTROL 1 MMOL/ML IV SOLN COMPARISON:  CT abdomen pelvis, 08/01/2021 FINDINGS: Examination is generally limited by breath motion artifact throughout Lower chest: Numerous pulmonary nodules throughout the included bilateral lung bases, poorly evaluated by MR. Hepatobiliary: No mass or other parenchymal abnormality identified. No gallstones. Moderate intra and extrahepatic biliary ductal dilatation, the common bile duct measuring up to 0.9 cm. The common bile duct is abruptly truncated in the superior pancreatic head. Pancreas: Masslike, slightly hypoenhancing appearance of the pancreatic head, measuring approximately 3.5 x 2.2 cm (series 3, image 20), with abrupt truncation of the pancreatic duct in the superior pancreatic head, the pancreatic duct dilated distally up to 0.7 cm, with atrophy of the distal pancreatic parenchyma.No pancreatic ductal dilatation. Spleen:  Within normal limits in size and appearance. Adrenals/Urinary Tract: Normal adrenal glands. No renal masses or suspicious contrast enhancement identified. No evidence of hydronephrosis. Stomach/Bowel: Visualized portions within the abdomen are unremarkable. Vascular/Lymphatic: No discretely enlarged lymph nodes identified. There is abnormal contrast enhancing soft tissue about the celiac axis (series  21, image 39). No abdominal aortic aneurysm demonstrated. Breath motion artifact significantly limits evaluation of the vessels in the vicinity of suspected pancreatic head mass; particularly the superior mesenteric vein, central splenic vein, and portal vein are not meaningfully assessed on this examination but were patent on prior CT dated 08/01/2021. Other:  None. Musculoskeletal: No suspicious osseous lesions identified. IMPRESSION: 1. Masslike, slightly hypoenhancing appearance of the pancreatic head, measuring approximately 3.5 x 2.2 cm, with abrupt truncation of the common bile duct and pancreatic duct in the superior pancreatic head, highly suspicious for pancreatic adenocarcinoma. 2. There are no discretely enlarged lymph nodes, however there is abnormal contrast enhancing soft tissue about the celiac axis, suspicious for nodal metastatic disease. 3. Numerous pulmonary nodules throughout the included bilateral lung bases, poorly evaluated by MR, better seen by prior CT, however these remain highly suspicious for pulmonary metastases. 4. Due to breath motion artifact, the vascular structures, particularly the superior mesenteric, central splenic, and portal veins are poorly assessed, but were patent on prior CT. 5. Due to significant limitations of breath motion artifact on this examination, consider contrast enhanced CT for future follow-up and restaging imaging by CT. Electronically Signed   By: Delanna Ahmadi M.D.   On: 08/02/2021 09:35   MR ABDOMEN MRCP W WO CONTAST  Result Date: 08/02/2021 CLINICAL DATA:  Nausea, vomiting, biliary ductal dilatation, pancreatic ductal dilatation, possible pancreatic head mass EXAM: MRI ABDOMEN WITHOUT AND WITH CONTRAST (INCLUDING MRCP) TECHNIQUE: Multiplanar multisequence MR imaging of the abdomen was performed both before and after the administration of intravenous contrast. Heavily T2-weighted images of the biliary and pancreatic ducts were obtained, and  three-dimensional MRCP images were rendered by post processing. CONTRAST:  2mL GADAVIST GADOBUTROL 1 MMOL/ML IV SOLN COMPARISON:  CT abdomen pelvis, 08/01/2021 FINDINGS: Examination is generally limited by breath motion artifact throughout Lower chest: Numerous pulmonary nodules throughout the included bilateral lung bases, poorly evaluated by MR. Hepatobiliary: No mass or other parenchymal abnormality identified. No gallstones. Moderate intra and extrahepatic biliary ductal dilatation, the common bile  duct measuring up to 0.9 cm. The common bile duct is abruptly truncated in the superior pancreatic head. Pancreas: Masslike, slightly hypoenhancing appearance of the pancreatic head, measuring approximately 3.5 x 2.2 cm (series 3, image 20), with abrupt truncation of the pancreatic duct in the superior pancreatic head, the pancreatic duct dilated distally up to 0.7 cm, with atrophy of the distal pancreatic parenchyma.No pancreatic ductal dilatation. Spleen:  Within normal limits in size and appearance. Adrenals/Urinary Tract: Normal adrenal glands. No renal masses or suspicious contrast enhancement identified. No evidence of hydronephrosis. Stomach/Bowel: Visualized portions within the abdomen are unremarkable. Vascular/Lymphatic: No discretely enlarged lymph nodes identified. There is abnormal contrast enhancing soft tissue about the celiac axis (series 21, image 39). No abdominal aortic aneurysm demonstrated. Breath motion artifact significantly limits evaluation of the vessels in the vicinity of suspected pancreatic head mass; particularly the superior mesenteric vein, central splenic vein, and portal vein are not meaningfully assessed on this examination but were patent on prior CT dated 08/01/2021. Other:  None. Musculoskeletal: No suspicious osseous lesions identified. IMPRESSION: 1. Masslike, slightly hypoenhancing appearance of the pancreatic head, measuring approximately 3.5 x 2.2 cm, with abrupt truncation  of the common bile duct and pancreatic duct in the superior pancreatic head, highly suspicious for pancreatic adenocarcinoma. 2. There are no discretely enlarged lymph nodes, however there is abnormal contrast enhancing soft tissue about the celiac axis, suspicious for nodal metastatic disease. 3. Numerous pulmonary nodules throughout the included bilateral lung bases, poorly evaluated by MR, better seen by prior CT, however these remain highly suspicious for pulmonary metastases. 4. Due to breath motion artifact, the vascular structures, particularly the superior mesenteric, central splenic, and portal veins are poorly assessed, but were patent on prior CT. 5. Due to significant limitations of breath motion artifact on this examination, consider contrast enhanced CT for future follow-up and restaging imaging by CT. Electronically Signed   By: Delanna Ahmadi M.D.   On: 08/02/2021 09:35   US Abdomen Limited RUQ (LIVER/GB)  Result Date: 08/01/2021 CLINICAL DATA:  Jaundice. Itching skin. Generalized abdominal pain for 6 days. EXAM: ULTRASOUND ABDOMEN LIMITED RIGHT UPPER QUADRANT COMPARISON:  05/07/2004. FINDINGS: Gallbladder: Distended, but with no stones or wall thickening or pericholecystic fluid. There is a small amount of gallbladder sludge and the patient is tender over the gallbladder to transducer pressure. Common bile duct: Diameter: 7 mm.  Distal duct not visualized. Liver: Normal in size and overall echogenicity. No mass. Mild central intrahepatic bile duct dilation. Portal vein is patent on color Doppler imaging with normal direction of blood flow towards the liver. Other: None. IMPRESSION: 1. Common bile duct measures 7 mm and there is mild central intrahepatic bile duct dilation. The distal common bile duct is not well visualized. Cannot exclude a distal duct stone or other obstructing lesion. Given the history of jaundice, consider follow-up MRCP/ERCP. 2. Distended gallbladder, no stones and no  convincing acute cholecystitis. Electronically Signed   By: Lajean Manes M.D.   On: 08/01/2021 16:01    ROS:  As stated above in the HPI otherwise negative.  Blood pressure (!) 145/72, pulse 79, temperature 98.3 F (36.8 C), temperature source Oral, resp. rate 16, SpO2 93 %.    PE: Gen: NAD, Alert and Oriented, jaundiced HEENT:  [place]/AT, EOMI, scleral icterus Neck: Supple, no LAD Lungs: CTA Bilaterally CV: RRR without M/G/R ABD: Soft, NTND, +BS Ext: No C/C/E  Assessment/Plan: 1) Pancreatic head mass. 2) Obstructive jaundice. 3) Family history of pancreatic cancer.  The patient's clinical presentation is consistent with pancreatic cancer.  She does have a significant obstruction and an ERCP with stent placement is necessary.  Plan: 1) ERCP with stent placement tomorrow. 2) ? Utility of performing an EUS with FNA for metastatic disease. 3) Check CA19-9.  Orey Moure D 08/02/2021, 1:51 PM

## 2021-08-02 NOTE — ED Provider Notes (Signed)
[facility] Provider Note   CSN: [id number] Arrival date & time: 08/01/21  1410     History  Chief Complaint  Patient presents with   Abdominal Pain    Annette Key is a 81 y.o. female.  Presented to ER with concern for yellowing skin.  Patient reports that over the past week or so she is felt generally nauseous, malaise.  The husband reports that over the last few days he has noted yellowing of her skin.  Patient states that she has had some general abdominal discomfort, worse in her upper abdomen.  Decreased appetite, low energy levels.  No vomiting.  She has endorsed weight loss but is unsure how much.  No dark stools, states her stools have been gray/whitish.  She denies any prior personal oncologic history.  Her sister had pancreatic cancer.  Completed chart reviewed, noted last outpatient note which was from cardiology.  Seen for hypotension.  HPI     Home Medications Prior to Admission medications   Medication Sig Start Date End Date Taking? Authorizing Provider  famotidine (PEPCID) 20 MG tablet Take 20 mg by mouth daily as needed for heartburn. 04/30/21  Yes [provider]      Allergies    Sulfa antibiotics    Review of Systems   Review of Systems  Constitutional:  Positive for fatigue and unexpected weight change. Negative for chills and fever.  HENT:  Negative for ear pain and sore throat.   Eyes:  Negative for pain and visual disturbance.  Respiratory:  Negative for cough and shortness of breath.   Cardiovascular:  Negative for chest pain and palpitations.  Gastrointestinal:  Positive for abdominal pain and nausea. Negative for vomiting.  Genitourinary:  Negative for dysuria and hematuria.  Musculoskeletal:  Negative for arthralgias and back pain.  Skin:  Positive for color change and pallor. Negative for rash.  Neurological:  Negative for seizures and syncope.  All other systems reviewed and are negative.  Physical  Exam Updated Vital Signs BP (!) 145/72 (BP Location: Left Arm)    Pulse 79    Temp 98.3 F (36.8 C) (Oral)    Resp 16    SpO2 93%  Physical Exam Vitals and nursing note reviewed.  Constitutional:      General: She is not in acute distress.    Comments: Obviously jaundiced skin  HENT:     Head: Normocephalic and atraumatic.  Eyes:     General: Scleral icterus present.     Conjunctiva/sclera: Conjunctivae normal.  Cardiovascular:     Rate and Rhythm: Normal rate and regular rhythm.     Heart sounds: No murmur heard. Pulmonary:     Effort: Pulmonary effort is normal. No respiratory distress.     Breath sounds: Normal breath sounds.  Abdominal:     Palpations: Abdomen is soft.     Tenderness: There is abdominal tenderness in the right upper quadrant and epigastric area.  Musculoskeletal:        General: No swelling.     Cervical back: Neck supple.  Skin:    General: Skin is warm and dry.     Capillary Refill: Capillary refill takes less than 2 seconds.     Coloration: Skin is jaundiced and pale.  Neurological:     Mental Status: She is alert.  Psychiatric:        Mood and Affect: Mood normal.    ED Results / Procedures / Treatments   Labs (all  labs ordered are listed, but only abnormal results are displayed) Labs Reviewed  URINALYSIS, ROUTINE W REFLEX MICROSCOPIC - Abnormal; Notable for the following components:      Result Value   Specific Gravity, Urine >1.046 (*)    Bilirubin Urine MODERATE (*)    Ketones, ur TRACE (*)    Protein, ur TRACE (*)    All other components within normal limits  BASIC METABOLIC PANEL - Abnormal; Notable for the following components:   Sodium 132 (*)    Chloride 95 (*)    Glucose, Bld 135 (*)    All other components within normal limits  HEPATIC FUNCTION PANEL - Abnormal; Notable for the following components:   AST 571 (*)    ALT 1,361 (*)    Alkaline Phosphatase 380 (*)    Total Bilirubin 12.6 (*)    Bilirubin, Direct 7.4 (*)     Indirect Bilirubin 5.2 (*)    All other components within normal limits  COMPREHENSIVE METABOLIC PANEL - Abnormal; Notable for the following components:   Sodium 134 (*)    Glucose, Bld 112 (*)    Calcium 8.8 (*)    Total Protein 6.3 (*)    Albumin 3.1 (*)    AST 521 (*)    ALT 1,130 (*)    Alkaline Phosphatase 278 (*)    Total Bilirubin 10.2 (*)    All other components within normal limits  RESP PANEL BY RT-PCR (FLU A&B, COVID) ARPGX2  LIPASE, BLOOD  CBC  PROTIME-INR  CBC  CANCER ANTIGEN 19-9    EKG None  Radiology CT CHEST W CONTRAST  Result Date: 08/02/2021 CLINICAL DATA:  Presumed new diagnosis pancreatic cancer staging, pulmonary nodules EXAM: CT CHEST WITH CONTRAST TECHNIQUE: Multidetector CT imaging of the chest was performed during intravenous contrast administration. RADIATION DOSE REDUCTION: This exam was performed according to the departmental dose-optimization program which includes automated exposure control, adjustment of the mA and/or kV according to patient size and/or use of iterative reconstruction technique. CONTRAST:  46mL OMNIPAQUE IOHEXOL 300 MG/ML  SOLN COMPARISON:  CT abdomen pelvis, 08/01/2021 FINDINGS: Cardiovascular: Aortic atherosclerosis. Normal heart size. Scattered coronary artery calcifications. No pericardial effusion. Mediastinum/Nodes: No enlarged mediastinal, hilar, or axillary lymph nodes. Thyroid gland, trachea, and esophagus demonstrate no significant findings. Lungs/Pleura: Innumerable bilateral pulmonary nodules of varying sizes, largest in the right lung base measuring 1.5 x 1.4 cm (series 5, image 111). Additional index nodule of the posterior right apex measures 1.3 x 1.2 cm (series 5, image 33). No pleural effusion or pneumothorax. Upper Abdomen: No acute abnormality. Biliary and pancreatic ductal dilatation partially imaged masslike lesion of the pancreatic head better assessed by prior dedicated examination of the abdomen. Musculoskeletal: No  chest wall abnormality. No suspicious osseous lesions identified. IMPRESSION: 1. Innumerable bilateral pulmonary nodules of varying sizes, consistent with metastatic disease. 2. Biliary and pancreatic ductal dilatation as well as partially imaged masslike lesion of the pancreatic head, better assessed by prior dedicated examinations of the abdomen. 3. Coronary artery disease. Aortic Atherosclerosis (ICD10-I70.0). Electronically Signed   By: Delanna Ahmadi M.D.   On: 08/02/2021 13:10   CT ABDOMEN PELVIS W CONTRAST  Result Date: 08/01/2021 CLINICAL DATA:  Nausea vomiting. EXAM: CT ABDOMEN AND PELVIS WITH CONTRAST TECHNIQUE: Multidetector CT imaging of the abdomen and pelvis was performed using the standard protocol following bolus administration of intravenous contrast. RADIATION DOSE REDUCTION: This exam was performed according to the departmental dose-optimization program which includes automated exposure control, adjustment of the mA  and/or kV according to patient size and/or use of iterative reconstruction technique. CONTRAST:  185mL OMNIPAQUE IOHEXOL 300 MG/ML  SOLN COMPARISON:  None. FINDINGS: Lower chest: Solid pulmonary nodules versus areas of consolidation in the right lower lobe, the largest measuring 1.5 cm. Multiple smaller solid subpleural pulmonary nodules in the left lung base. Hepatobiliary: Diffuse intrahepatic biliary ductal dilation. Normal appearance of the gallbladder. Mild distention of the proximal common bile duct. The distal common bile duct is difficult to visualize. Pancreas: Diffuse cystic dilation of the main pancreatic duct involving the body and tail of the pancreas. Bulbous heterogeneous appearance of the head of the pancreas. Spleen: Normal in size without focal abnormality. Adrenals/Urinary Tract: Adrenal glands are unremarkable. Kidneys are normal, without renal calculi, focal lesion, or hydronephrosis. Bladder is unremarkable. Stomach/Bowel: Stomach is within normal limits.  Appendix appears normal. No evidence of bowel wall thickening, distention, or inflammatory changes. Vascular/Lymphatic: Aortic atherosclerosis. No enlarged abdominal or pelvic lymph nodes. 9 mm short axis lymph node in the porta hepatic is noted. Reproductive: Uterus and bilateral adnexa are unremarkable. Other: No abdominal wall hernia or abnormality. No abdominopelvic ascites. Musculoskeletal: No acute or significant osseous findings. IMPRESSION: 1. Diffuse intrahepatic biliary ductal dilation with mild distention of the proximal common bile duct. The distal common bile duct is difficult to visualize. Bulbous heterogeneous appearance of the head of the pancreas. Cystic dilation of the main pancreatic duct in the body and head of the pancreas. Further evaluation with pancreatic mass protocol MRI of the abdomen may be considered. 2. Solid pulmonary nodules versus areas of consolidation in the right lower lobe, the largest measuring 1.5 cm. Multiple smaller solid subpleural pulmonary nodules in the left lung base. Metastatic disease cannot be excluded. Further evaluation with complete chest CT, or PET-CT if the imaging of the abdomen is positive for malignancy, may be considered. 3. 9 mm short axis lymph node in the porta hepatic region, indeterminate. 4. Aortic atherosclerosis. Aortic Atherosclerosis (ICD10-I70.0). Electronically Signed   By: Fidela Salisbury M.D.   On: 08/01/2021 16:35   MR 3D Recon At Scanner  Result Date: 08/02/2021 CLINICAL DATA:  Nausea, vomiting, biliary ductal dilatation, pancreatic ductal dilatation, possible pancreatic head mass EXAM: MRI ABDOMEN WITHOUT AND WITH CONTRAST (INCLUDING MRCP) TECHNIQUE: Multiplanar multisequence MR imaging of the abdomen was performed both before and after the administration of intravenous contrast. Heavily T2-weighted images of the biliary and pancreatic ducts were obtained, and three-dimensional MRCP images were rendered by post processing. CONTRAST:   110mL GADAVIST GADOBUTROL 1 MMOL/ML IV SOLN COMPARISON:  CT abdomen pelvis, 08/01/2021 FINDINGS: Examination is generally limited by breath motion artifact throughout Lower chest: Numerous pulmonary nodules throughout the included bilateral lung bases, poorly evaluated by MR. Hepatobiliary: No mass or other parenchymal abnormality identified. No gallstones. Moderate intra and extrahepatic biliary ductal dilatation, the common bile duct measuring up to 0.9 cm. The common bile duct is abruptly truncated in the superior pancreatic head. Pancreas: Masslike, slightly hypoenhancing appearance of the pancreatic head, measuring approximately 3.5 x 2.2 cm (series 3, image 20), with abrupt truncation of the pancreatic duct in the superior pancreatic head, the pancreatic duct dilated distally up to 0.7 cm, with atrophy of the distal pancreatic parenchyma.No pancreatic ductal dilatation. Spleen:  Within normal limits in size and appearance. Adrenals/Urinary Tract: Normal adrenal glands. No renal masses or suspicious contrast enhancement identified. No evidence of hydronephrosis. Stomach/Bowel: Visualized portions within the abdomen are unremarkable. Vascular/Lymphatic: No discretely enlarged lymph nodes identified. There is abnormal contrast enhancing  soft tissue about the celiac axis (series 21, image 39). No abdominal aortic aneurysm demonstrated. Breath motion artifact significantly limits evaluation of the vessels in the vicinity of suspected pancreatic head mass; particularly the superior mesenteric vein, central splenic vein, and portal vein are not meaningfully assessed on this examination but were patent on prior CT dated 08/01/2021. Other:  None. Musculoskeletal: No suspicious osseous lesions identified. IMPRESSION: 1. Masslike, slightly hypoenhancing appearance of the pancreatic head, measuring approximately 3.5 x 2.2 cm, with abrupt truncation of the common bile duct and pancreatic duct in the superior pancreatic head,  highly suspicious for pancreatic adenocarcinoma. 2. There are no discretely enlarged lymph nodes, however there is abnormal contrast enhancing soft tissue about the celiac axis, suspicious for nodal metastatic disease. 3. Numerous pulmonary nodules throughout the included bilateral lung bases, poorly evaluated by MR, better seen by prior CT, however these remain highly suspicious for pulmonary metastases. 4. Due to breath motion artifact, the vascular structures, particularly the superior mesenteric, central splenic, and portal veins are poorly assessed, but were patent on prior CT. 5. Due to significant limitations of breath motion artifact on this examination, consider contrast enhanced CT for future follow-up and restaging imaging by CT. Electronically Signed   By: Delanna Ahmadi M.D.   On: 08/02/2021 09:35   MR ABDOMEN MRCP W WO CONTAST  Result Date: 08/02/2021 CLINICAL DATA:  Nausea, vomiting, biliary ductal dilatation, pancreatic ductal dilatation, possible pancreatic head mass EXAM: MRI ABDOMEN WITHOUT AND WITH CONTRAST (INCLUDING MRCP) TECHNIQUE: Multiplanar multisequence MR imaging of the abdomen was performed both before and after the administration of intravenous contrast. Heavily T2-weighted images of the biliary and pancreatic ducts were obtained, and three-dimensional MRCP images were rendered by post processing. CONTRAST:  22mL GADAVIST GADOBUTROL 1 MMOL/ML IV SOLN COMPARISON:  CT abdomen pelvis, 08/01/2021 FINDINGS: Examination is generally limited by breath motion artifact throughout Lower chest: Numerous pulmonary nodules throughout the included bilateral lung bases, poorly evaluated by MR. Hepatobiliary: No mass or other parenchymal abnormality identified. No gallstones. Moderate intra and extrahepatic biliary ductal dilatation, the common bile duct measuring up to 0.9 cm. The common bile duct is abruptly truncated in the superior pancreatic head. Pancreas: Masslike, slightly hypoenhancing  appearance of the pancreatic head, measuring approximately 3.5 x 2.2 cm (series 3, image 20), with abrupt truncation of the pancreatic duct in the superior pancreatic head, the pancreatic duct dilated distally up to 0.7 cm, with atrophy of the distal pancreatic parenchyma.No pancreatic ductal dilatation. Spleen:  Within normal limits in size and appearance. Adrenals/Urinary Tract: Normal adrenal glands. No renal masses or suspicious contrast enhancement identified. No evidence of hydronephrosis. Stomach/Bowel: Visualized portions within the abdomen are unremarkable. Vascular/Lymphatic: No discretely enlarged lymph nodes identified. There is abnormal contrast enhancing soft tissue about the celiac axis (series 21, image 39). No abdominal aortic aneurysm demonstrated. Breath motion artifact significantly limits evaluation of the vessels in the vicinity of suspected pancreatic head mass; particularly the superior mesenteric vein, central splenic vein, and portal vein are not meaningfully assessed on this examination but were patent on prior CT dated 08/01/2021. Other:  None. Musculoskeletal: No suspicious osseous lesions identified. IMPRESSION: 1. Masslike, slightly hypoenhancing appearance of the pancreatic head, measuring approximately 3.5 x 2.2 cm, with abrupt truncation of the common bile duct and pancreatic duct in the superior pancreatic head, highly suspicious for pancreatic adenocarcinoma. 2. There are no discretely enlarged lymph nodes, however there is abnormal contrast enhancing soft tissue about the celiac axis, suspicious for nodal metastatic  disease. 3. Numerous pulmonary nodules throughout the included bilateral lung bases, poorly evaluated by MR, better seen by prior CT, however these remain highly suspicious for pulmonary metastases. 4. Due to breath motion artifact, the vascular structures, particularly the superior mesenteric, central splenic, and portal veins are poorly assessed, but were patent on  prior CT. 5. Due to significant limitations of breath motion artifact on this examination, consider contrast enhanced CT for future follow-up and restaging imaging by CT. Electronically Signed   By: Delanna Ahmadi M.D.   On: 08/02/2021 09:35   US Abdomen Limited RUQ (LIVER/GB)  Result Date: 08/01/2021 CLINICAL DATA:  Jaundice. Itching skin. Generalized abdominal pain for 6 days. EXAM: ULTRASOUND ABDOMEN LIMITED RIGHT UPPER QUADRANT COMPARISON:  05/07/2004. FINDINGS: Gallbladder: Distended, but with no stones or wall thickening or pericholecystic fluid. There is a small amount of gallbladder sludge and the patient is tender over the gallbladder to transducer pressure. Common bile duct: Diameter: 7 mm.  Distal duct not visualized. Liver: Normal in size and overall echogenicity. No mass. Mild central intrahepatic bile duct dilation. Portal vein is patent on color Doppler imaging with normal direction of blood flow towards the liver. Other: None. IMPRESSION: 1. Common bile duct measures 7 mm and there is mild central intrahepatic bile duct dilation. The distal common bile duct is not well visualized. Cannot exclude a distal duct stone or other obstructing lesion. Given the history of jaundice, consider follow-up MRCP/ERCP. 2. Distended gallbladder, no stones and no convincing acute cholecystitis. Electronically Signed   By: Lajean Manes M.D.   On: 08/01/2021 16:01    Procedures Procedures    Medications Ordered in ED Medications  lactated ringers infusion ( Intravenous Infusion Verify 08/02/21 1550)  ondansetron (ZOFRAN) tablet 4 mg (has no administration in time range)    Or  ondansetron (ZOFRAN) injection 4 mg (has no administration in time range)  oxyCODONE (Oxy IR/ROXICODONE) immediate release tablet 5 mg (5 mg Oral Given 08/02/21 1201)  ketorolac (TORADOL) 15 MG/ML injection 15 mg (has no administration in time range)  iohexol (OMNIPAQUE) 300 MG/ML solution 100 mL (100 mLs Intravenous Contrast Given  08/01/21 1615)  gadobutrol (GADAVIST) 1 MMOL/ML injection 7 mL (7 mLs Intravenous Contrast Given 08/02/21 0917)  iohexol (OMNIPAQUE) 300 MG/ML solution 75 mL (75 mLs Intravenous Contrast Given 08/02/21 1238)    ED Course/ Medical Decision Making/ A&P Clinical Course as of 08/02/21 1620  Sun Aug 01, 2021  1724 I spoke to Dr Stacie Glaze GI who recommends transfer to [facility] for MRI of the pancreas to evaluate mass; team will consult on patient after admission.   Paged for admission [MT]    Clinical Course User Index [MT] Trifan, Carola Rhine, MD                           Medical Decision Making  81 year old lady presents to ER with concern for jaundiced skin.  On physical exam patient noted to have obviously jaundiced skin and scleral icterus.  Notable historical elements include weight loss, fatigue, upper abdominal discomfort and nausea.  She denied prior history of cancer, did have positive family history of pancreatic cancer.  Additional history was obtained from chart review, review of outpatient notes.  Additional history also obtained from husband at bedside.  Broad differential considered.  Principally considered for new oncologic process in the pancreas, gallbladder or liver.  Concern for possible biliary obstruction or cholecystitis as well.  We will start with  broad laboratory work-up and right upper quadrant ultrasound.  Anticipate she will require GI consult and admission and may need more advanced imaging such as CT scan or MRI.  At time of signout, her lab work had just resulted and ultrasound was in process.  Labs most concerning for transaminitis, profoundly elevated bilirubin level.  Ultrasound is pending.  Discussed with Dr. Langston Masker who will follow-up on additional testing and consult GI.        Final Clinical Impression(s) / ED Diagnoses Final diagnoses:  RUQ pain  Pancreatic mass  Jaundice  Transaminitis    Rx / DC Orders ED Discharge Orders     None         Lucrezia Starch, MD 08/02/21 [phone]

## 2021-08-02 NOTE — Progress Notes (Signed)
PROGRESS NOTE    Annette Key  PQZ:[id number] DOB: 08-05-1940 DOA: 08/01/2021 PCP: Derinda Late, MD    Brief Narrative:  81 y.o. female with no significant past medical history presents with complaint of abdominal pain, gray stools, fatigue and jaundice.  She reports that for the last few weeks she has been having intermittent mid upper and right upper quadrant abdominal pain that has progressively worsened.  She reports she has a decreased appetite and decreased energy level over the last week.  Her husband noticed her skin today being very yellow and with her abdominal pain brought her to the emergency room.  She has been having nausea for the past week or 2.  She states that her stools became gray to white which concerned her.  Ultrasound showed biliary dilatation with a questionable mass in the pancreatic head.  CT of the abdomen confirmed biliary dilatation but cannot characterize the pancreas well.  MRCP noted pancreatic head mass. GI was consulted  Assessment & Plan:   Principal Problem:   Biliary obstruction Active Problems:   Jaundice   RUQ pain   Transaminitis   Pancreatic mass   Hyponatremia  Principal Problem:   Biliary obstruction with pancreatic head mass MRCP reviewed with pt and family. Concerns of pancreatic head mass GI consulted with recs for ERCP tomorrow [id number] pending   Active Problems:   Transaminitis LFTs elevated due to biliary obstruction Somewhat improved Repeat CMP in AM     Pancreatic mass Confirmed on MRCP GI following with ERCP pending     Jaundice Due to biliary obstruction.  IVF hydration with LR at 100 ml/hr Repeat CMP in AM       Hyponatremia Mild. Secondary to dehydration with poor po intake recently -Improving with hydration -Repeat bmet in AM      Pulmonary nodule Multiple nodules in the right base of lung.  Have ordered CT chest to r/o metastatic disease   DVT prophylaxis: SCD's Code Status: Full Family Communication:  Pt in room, family at bedside  Status is: Observation  The patient will require care spanning > 2 midnights and should be moved to inpatient because: severity of illness   Consultants:  GI  Procedures:    Antimicrobials: Anti-infectives (From admission, onward)    None       Subjective: Complaining of mild abd discomfort  Objective: Vitals:   08/02/21 0130 08/02/21 0354 08/02/21 0948 08/02/21 1254  BP: 120/68 129/79 134/73 (!) 145/72  Pulse: 69 76 79 79  Resp: 18 16 16 16   Temp: 98.5 F (36.9 C) 99.1 F (37.3 C) 98 F (36.7 C) 98.3 F (36.8 C)  TempSrc: Oral Oral Oral Oral  SpO2: 98% 97% 92% 93%    Intake/Output Summary (Last 24 hours) at 08/02/2021 1729 Last data filed at 08/02/2021 1550 Gross per 24 hour  Intake 1770.24 ml  Output --  Net 1770.24 ml   There were no vitals filed for this visit.  Examination: General exam: Awake, laying in bed, in nad Respiratory system: Normal respiratory effort, no wheezing Cardiovascular system: regular rate, s1, s2 Gastrointestinal system: Soft, nondistended, positive BS Central nervous system: CN2-12 grossly intact, strength intact Extremities: Perfused, no clubbing Skin: Normal skin turgor, no notable skin lesions seen, jaundice Psychiatry: Mood normal // no visual hallucinations   Data Reviewed: I have personally reviewed following labs and imaging studies  CBC: Recent Labs  Lab 08/01/21 1444 08/02/21 0510  WBC 9.2 7.2  HGB 14.2 12.4  HCT 42.7 38.0  MCV 89.0 90.9  PLT 289 287   Basic Metabolic Panel: Recent Labs  Lab 08/01/21 1433 08/02/21 0510  NA 132* 134*  K 4.0 4.3  CL 95* 100  CO2 25 26  GLUCOSE 135* 112*  BUN 16 14  CREATININE 0.86 0.62  CALCIUM 9.6 8.8*   GFR: CrCl cannot be calculated (Unknown ideal weight.). Liver Function Tests: Recent Labs  Lab 08/01/21 1433 08/02/21 0510  AST 571* 521*  ALT 1,361* 1,130*  ALKPHOS 380* 278*  BILITOT 12.6* 10.2*  PROT 7.1 6.3*  ALBUMIN 4.2  3.1*   Recent Labs  Lab 08/01/21 1444  LIPASE 26   No results for input(s): AMMONIA in the last 168 hours. Coagulation Profile: Recent Labs  Lab 08/01/21 1444  INR 1.0   Cardiac Enzymes: No results for input(s): CKTOTAL, CKMB, CKMBINDEX, TROPONINI in the last 168 hours. BNP (last 3 results) No results for input(s): PROBNP in the last 8760 hours. HbA1C: No results for input(s): HGBA1C in the last 72 hours. CBG: No results for input(s): GLUCAP in the last 168 hours. Lipid Profile: No results for input(s): CHOL, HDL, LDLCALC, TRIG, CHOLHDL, LDLDIRECT in the last 72 hours. Thyroid Function Tests: No results for input(s): TSH, T4TOTAL, FREET4, T3FREE, THYROIDAB in the last 72 hours. Anemia Panel: No results for input(s): VITAMINB12, FOLATE, FERRITIN, TIBC, IRON, RETICCTPCT in the last 72 hours. Sepsis Labs: No results for input(s): PROCALCITON, LATICACIDVEN in the last 168 hours.  Recent Results (from the past 240 hour(s))  Resp Panel by RT-PCR (Flu A&B, Covid) Nasopharyngeal Swab     Status: None   Collection Time: 08/01/21  3:24 PM   Specimen: Nasopharyngeal Swab; Nasopharyngeal(NP) swabs in vial transport medium  Result Value Ref Range Status   SARS Coronavirus 2 by RT PCR NEGATIVE NEGATIVE Final    Comment: (NOTE) SARS-CoV-2 target nucleic acids are NOT DETECTED.  The SARS-CoV-2 RNA is generally detectable in upper respiratory specimens during the acute phase of infection. The lowest concentration of SARS-CoV-2 viral copies this assay can detect is 138 copies/mL. A negative result does not preclude SARS-Cov-2 infection and should not be used as the sole basis for treatment or other patient management decisions. A negative result may occur with  improper specimen collection/handling, submission of specimen other than nasopharyngeal swab, presence of viral mutation(s) within the areas targeted by this assay, and inadequate number of viral copies(<138 copies/mL). A  negative result must be combined with clinical observations, patient history, and epidemiological information. The expected result is Negative.  Fact Sheet for Patients:  EntrepreneurPulse.com.au  Fact Sheet for Healthcare Providers:  IncredibleEmployment.be  This test is no t yet approved or cleared by the [place] FDA and  has been authorized for detection and/or diagnosis of SARS-CoV-2 by FDA under an Emergency Use Authorization (EUA). This EUA will remain  in effect (meaning this test can be used) for the duration of the COVID-19 declaration under Section 564(b)(1) of the Act, 21 U.S.C.section 360bbb-3(b)(1), unless the authorization is terminated  or revoked sooner.       Influenza A by PCR NEGATIVE NEGATIVE Final   Influenza B by PCR NEGATIVE NEGATIVE Final    Comment: (NOTE) The Xpert Xpress SARS-CoV-2/FLU/RSV plus assay is intended as an aid in the diagnosis of influenza from Nasopharyngeal swab specimens and should not be used as a sole basis for treatment. Nasal washings and aspirates are unacceptable for Xpert Xpress SARS-CoV-2/FLU/RSV testing.  Fact Sheet for Patients: EntrepreneurPulse.com.au  Fact Sheet for Healthcare Providers: IncredibleEmployment.be  This test is not yet approved or cleared by the [place] and has been authorized for detection and/or diagnosis of SARS-CoV-2 by FDA under an Emergency Use Authorization (EUA). This EUA will remain in effect (meaning this test can be used) for the duration of the COVID-19 declaration under Section 564(b)(1) of the Act, 21 U.S.C. section 360bbb-3(b)(1), unless the authorization is terminated or revoked.  Performed at [facility], [address] [place], [place] [address]      Radiology Studies: CT CHEST W CONTRAST  Result Date: 08/02/2021 CLINICAL DATA:  Presumed new diagnosis pancreatic cancer  staging, pulmonary nodules EXAM: CT CHEST WITH CONTRAST TECHNIQUE: Multidetector CT imaging of the chest was performed during intravenous contrast administration. RADIATION DOSE REDUCTION: This exam was performed according to the departmental dose-optimization program which includes automated exposure control, adjustment of the mA and/or kV according to patient size and/or use of iterative reconstruction technique. CONTRAST:  72mL OMNIPAQUE IOHEXOL 300 MG/ML  SOLN COMPARISON:  CT abdomen pelvis, 08/01/2021 FINDINGS: Cardiovascular: Aortic atherosclerosis. Normal heart size. Scattered coronary artery calcifications. No pericardial effusion. Mediastinum/Nodes: No enlarged mediastinal, hilar, or axillary lymph nodes. Thyroid gland, trachea, and esophagus demonstrate no significant findings. Lungs/Pleura: Innumerable bilateral pulmonary nodules of varying sizes, largest in the right lung base measuring 1.5 x 1.4 cm (series 5, image 111). Additional index nodule of the posterior right apex measures 1.3 x 1.2 cm (series 5, image 33). No pleural effusion or pneumothorax. Upper Abdomen: No acute abnormality. Biliary and pancreatic ductal dilatation partially imaged masslike lesion of the pancreatic head better assessed by prior dedicated examination of the abdomen. Musculoskeletal: No chest wall abnormality. No suspicious osseous lesions identified. IMPRESSION: 1. Innumerable bilateral pulmonary nodules of varying sizes, consistent with metastatic disease. 2. Biliary and pancreatic ductal dilatation as well as partially imaged masslike lesion of the pancreatic head, better assessed by prior dedicated examinations of the abdomen. 3. Coronary artery disease. Aortic Atherosclerosis (ICD10-I70.0). Electronically Signed   By: Delanna Ahmadi M.D.   On: 08/02/2021 13:10   CT ABDOMEN PELVIS W CONTRAST  Result Date: 08/01/2021 CLINICAL DATA:  Nausea vomiting. EXAM: CT ABDOMEN AND PELVIS WITH CONTRAST TECHNIQUE: Multidetector CT  imaging of the abdomen and pelvis was performed using the standard protocol following bolus administration of intravenous contrast. RADIATION DOSE REDUCTION: This exam was performed according to the departmental dose-optimization program which includes automated exposure control, adjustment of the mA and/or kV according to patient size and/or use of iterative reconstruction technique. CONTRAST:  185mL OMNIPAQUE IOHEXOL 300 MG/ML  SOLN COMPARISON:  None. FINDINGS: Lower chest: Solid pulmonary nodules versus areas of consolidation in the right lower lobe, the largest measuring 1.5 cm. Multiple smaller solid subpleural pulmonary nodules in the left lung base. Hepatobiliary: Diffuse intrahepatic biliary ductal dilation. Normal appearance of the gallbladder. Mild distention of the proximal common bile duct. The distal common bile duct is difficult to visualize. Pancreas: Diffuse cystic dilation of the main pancreatic duct involving the body and tail of the pancreas. Bulbous heterogeneous appearance of the head of the pancreas. Spleen: Normal in size without focal abnormality. Adrenals/Urinary Tract: Adrenal glands are unremarkable. Kidneys are normal, without renal calculi, focal lesion, or hydronephrosis. Bladder is unremarkable. Stomach/Bowel: Stomach is within normal limits. Appendix appears normal. No evidence of bowel wall thickening, distention, or inflammatory changes. Vascular/Lymphatic: Aortic atherosclerosis. No enlarged abdominal or pelvic lymph nodes. 9 mm short axis lymph node in the porta hepatic is noted. Reproductive: Uterus and bilateral adnexa are unremarkable. Other: No  abdominal wall hernia or abnormality. No abdominopelvic ascites. Musculoskeletal: No acute or significant osseous findings. IMPRESSION: 1. Diffuse intrahepatic biliary ductal dilation with mild distention of the proximal common bile duct. The distal common bile duct is difficult to visualize. Bulbous heterogeneous appearance of the head  of the pancreas. Cystic dilation of the main pancreatic duct in the body and head of the pancreas. Further evaluation with pancreatic mass protocol MRI of the abdomen may be considered. 2. Solid pulmonary nodules versus areas of consolidation in the right lower lobe, the largest measuring 1.5 cm. Multiple smaller solid subpleural pulmonary nodules in the left lung base. Metastatic disease cannot be excluded. Further evaluation with complete chest CT, or PET-CT if the imaging of the abdomen is positive for malignancy, may be considered. 3. 9 mm short axis lymph node in the porta hepatic region, indeterminate. 4. Aortic atherosclerosis. Aortic Atherosclerosis (ICD10-I70.0). Electronically Signed   By: Fidela Salisbury M.D.   On: 08/01/2021 16:35   MR 3D Recon At Scanner  Result Date: 08/02/2021 CLINICAL DATA:  Nausea, vomiting, biliary ductal dilatation, pancreatic ductal dilatation, possible pancreatic head mass EXAM: MRI ABDOMEN WITHOUT AND WITH CONTRAST (INCLUDING MRCP) TECHNIQUE: Multiplanar multisequence MR imaging of the abdomen was performed both before and after the administration of intravenous contrast. Heavily T2-weighted images of the biliary and pancreatic ducts were obtained, and three-dimensional MRCP images were rendered by post processing. CONTRAST:  65mL GADAVIST GADOBUTROL 1 MMOL/ML IV SOLN COMPARISON:  CT abdomen pelvis, 08/01/2021 FINDINGS: Examination is generally limited by breath motion artifact throughout Lower chest: Numerous pulmonary nodules throughout the included bilateral lung bases, poorly evaluated by MR. Hepatobiliary: No mass or other parenchymal abnormality identified. No gallstones. Moderate intra and extrahepatic biliary ductal dilatation, the common bile duct measuring up to 0.9 cm. The common bile duct is abruptly truncated in the superior pancreatic head. Pancreas: Masslike, slightly hypoenhancing appearance of the pancreatic head, measuring approximately 3.5 x 2.2 cm  (series 3, image 20), with abrupt truncation of the pancreatic duct in the superior pancreatic head, the pancreatic duct dilated distally up to 0.7 cm, with atrophy of the distal pancreatic parenchyma.No pancreatic ductal dilatation. Spleen:  Within normal limits in size and appearance. Adrenals/Urinary Tract: Normal adrenal glands. No renal masses or suspicious contrast enhancement identified. No evidence of hydronephrosis. Stomach/Bowel: Visualized portions within the abdomen are unremarkable. Vascular/Lymphatic: No discretely enlarged lymph nodes identified. There is abnormal contrast enhancing soft tissue about the celiac axis (series 21, image 39). No abdominal aortic aneurysm demonstrated. Breath motion artifact significantly limits evaluation of the vessels in the vicinity of suspected pancreatic head mass; particularly the superior mesenteric vein, central splenic vein, and portal vein are not meaningfully assessed on this examination but were patent on prior CT dated 08/01/2021. Other:  None. Musculoskeletal: No suspicious osseous lesions identified. IMPRESSION: 1. Masslike, slightly hypoenhancing appearance of the pancreatic head, measuring approximately 3.5 x 2.2 cm, with abrupt truncation of the common bile duct and pancreatic duct in the superior pancreatic head, highly suspicious for pancreatic adenocarcinoma. 2. There are no discretely enlarged lymph nodes, however there is abnormal contrast enhancing soft tissue about the celiac axis, suspicious for nodal metastatic disease. 3. Numerous pulmonary nodules throughout the included bilateral lung bases, poorly evaluated by MR, better seen by prior CT, however these remain highly suspicious for pulmonary metastases. 4. Due to breath motion artifact, the vascular structures, particularly the superior mesenteric, central splenic, and portal veins are poorly assessed, but were patent on prior CT. 5. Due  to significant limitations of breath motion artifact on  this examination, consider contrast enhanced CT for future follow-up and restaging imaging by CT. Electronically Signed   By: Delanna Ahmadi M.D.   On: 08/02/2021 09:35   MR ABDOMEN MRCP W WO CONTAST  Result Date: 08/02/2021 CLINICAL DATA:  Nausea, vomiting, biliary ductal dilatation, pancreatic ductal dilatation, possible pancreatic head mass EXAM: MRI ABDOMEN WITHOUT AND WITH CONTRAST (INCLUDING MRCP) TECHNIQUE: Multiplanar multisequence MR imaging of the abdomen was performed both before and after the administration of intravenous contrast. Heavily T2-weighted images of the biliary and pancreatic ducts were obtained, and three-dimensional MRCP images were rendered by post processing. CONTRAST:  5mL GADAVIST GADOBUTROL 1 MMOL/ML IV SOLN COMPARISON:  CT abdomen pelvis, 08/01/2021 FINDINGS: Examination is generally limited by breath motion artifact throughout Lower chest: Numerous pulmonary nodules throughout the included bilateral lung bases, poorly evaluated by MR. Hepatobiliary: No mass or other parenchymal abnormality identified. No gallstones. Moderate intra and extrahepatic biliary ductal dilatation, the common bile duct measuring up to 0.9 cm. The common bile duct is abruptly truncated in the superior pancreatic head. Pancreas: Masslike, slightly hypoenhancing appearance of the pancreatic head, measuring approximately 3.5 x 2.2 cm (series 3, image 20), with abrupt truncation of the pancreatic duct in the superior pancreatic head, the pancreatic duct dilated distally up to 0.7 cm, with atrophy of the distal pancreatic parenchyma.No pancreatic ductal dilatation. Spleen:  Within normal limits in size and appearance. Adrenals/Urinary Tract: Normal adrenal glands. No renal masses or suspicious contrast enhancement identified. No evidence of hydronephrosis. Stomach/Bowel: Visualized portions within the abdomen are unremarkable. Vascular/Lymphatic: No discretely enlarged lymph nodes identified. There is abnormal  contrast enhancing soft tissue about the celiac axis (series 21, image 39). No abdominal aortic aneurysm demonstrated. Breath motion artifact significantly limits evaluation of the vessels in the vicinity of suspected pancreatic head mass; particularly the superior mesenteric vein, central splenic vein, and portal vein are not meaningfully assessed on this examination but were patent on prior CT dated 08/01/2021. Other:  None. Musculoskeletal: No suspicious osseous lesions identified. IMPRESSION: 1. Masslike, slightly hypoenhancing appearance of the pancreatic head, measuring approximately 3.5 x 2.2 cm, with abrupt truncation of the common bile duct and pancreatic duct in the superior pancreatic head, highly suspicious for pancreatic adenocarcinoma. 2. There are no discretely enlarged lymph nodes, however there is abnormal contrast enhancing soft tissue about the celiac axis, suspicious for nodal metastatic disease. 3. Numerous pulmonary nodules throughout the included bilateral lung bases, poorly evaluated by MR, better seen by prior CT, however these remain highly suspicious for pulmonary metastases. 4. Due to breath motion artifact, the vascular structures, particularly the superior mesenteric, central splenic, and portal veins are poorly assessed, but were patent on prior CT. 5. Due to significant limitations of breath motion artifact on this examination, consider contrast enhanced CT for future follow-up and restaging imaging by CT. Electronically Signed   By: Delanna Ahmadi M.D.   On: 08/02/2021 09:35   US Abdomen Limited RUQ (LIVER/GB)  Result Date: 08/01/2021 CLINICAL DATA:  Jaundice. Itching skin. Generalized abdominal pain for 6 days. EXAM: ULTRASOUND ABDOMEN LIMITED RIGHT UPPER QUADRANT COMPARISON:  05/07/2004. FINDINGS: Gallbladder: Distended, but with no stones or wall thickening or pericholecystic fluid. There is a small amount of gallbladder sludge and the patient is tender over the gallbladder to  transducer pressure. Common bile duct: Diameter: 7 mm.  Distal duct not visualized. Liver: Normal in size and overall echogenicity. No mass. Mild central intrahepatic bile duct dilation.  Portal vein is patent on color Doppler imaging with normal direction of blood flow towards the liver. Other: None. IMPRESSION: 1. Common bile duct measures 7 mm and there is mild central intrahepatic bile duct dilation. The distal common bile duct is not well visualized. Cannot exclude a distal duct stone or other obstructing lesion. Given the history of jaundice, consider follow-up MRCP/ERCP. 2. Distended gallbladder, no stones and no convincing acute cholecystitis. Electronically Signed   By: Lajean Manes M.D.   On: 08/01/2021 16:01    Scheduled Meds: Continuous Infusions:  lactated ringers 100 mL/hr at 08/02/21 1550     LOS: 0 days   Marylu Lund, MD [facility] Pager On Amion  If 7PM-7AM, please contact night-coverage 08/02/2021, 5:29 PM

## 2021-08-03 ENCOUNTER — Inpatient Hospital Stay (HOSPITAL_COMMUNITY): Payer: Medicare Other | Admitting: Certified Registered Nurse Anesthetist

## 2021-08-03 ENCOUNTER — Encounter (HOSPITAL_COMMUNITY)
Admission: EM | Disposition: A | Discharge status: Home / Self Care | Payer: Self-pay | Source: Home / Self Care | Attending: Internal Medicine

## 2021-08-03 ENCOUNTER — Inpatient Hospital Stay (HOSPITAL_COMMUNITY): Payer: Medicare Other

## 2021-08-03 DIAGNOSIS — E871 Hypo-osmolality and hyponatremia: Secondary | ICD-10-CM

## 2021-08-03 HISTORY — PX: SPHINCTEROTOMY: SHX5544

## 2021-08-03 HISTORY — PX: BILIARY STENT PLACEMENT: SHX5538

## 2021-08-03 HISTORY — PX: ERCP: SHX5425

## 2021-08-03 HISTORY — PX: FINE NEEDLE ASPIRATION: SHX5430

## 2021-08-03 HISTORY — PX: ESOPHAGOGASTRODUODENOSCOPY: SHX5428

## 2021-08-03 HISTORY — PX: EUS: SHX5427

## 2021-08-03 LAB — COMPREHENSIVE METABOLIC PANEL
ALT: 1097 U/L — ABNORMAL HIGH (ref 0–44)
AST: 501 U/L — ABNORMAL HIGH (ref 15–41)
Albumin: 3.1 g/dL — ABNORMAL LOW (ref 3.5–5.0)
Alkaline Phosphatase: 297 U/L — ABNORMAL HIGH (ref 38–126)
Anion gap: 5 (ref 5–15)
BUN: 9 mg/dL (ref 8–23)
CO2: 29 mmol/L (ref 22–32)
Calcium: 9 mg/dL (ref 8.9–10.3)
Chloride: 102 mmol/L (ref 98–111)
Creatinine, Ser: 0.56 mg/dL (ref 0.44–1.00)
GFR, Estimated: >60 mL/min (ref >60)
Glucose, Bld: 113 mg/dL — ABNORMAL HIGH (ref 70–99)
Potassium: 4.6 mmol/L (ref 3.5–5.1)
Sodium: 136 mmol/L (ref 135–145)
Total Bilirubin: 10.4 mg/dL — ABNORMAL HIGH (ref 0.3–1.2)
Total Protein: 6.2 g/dL — ABNORMAL LOW (ref 6.5–8.1)

## 2021-08-03 LAB — CBC
HCT: 37.3 % (ref 36.0–46.0)
Hemoglobin: 12.3 g/dL (ref 12.0–15.0)
MCH: 30 pg (ref 26.0–34.0)
MCHC: 33 g/dL (ref 30.0–36.0)
MCV: 91 fL (ref 80.0–100.0)
Platelets: 226 10*3/uL (ref 150–400)
RBC: 4.1 MIL/uL (ref 3.87–5.11)
RDW: 15.6 % — ABNORMAL HIGH (ref 11.5–15.5)
WBC: 6 10*3/uL (ref 4.0–10.5)
nRBC: 0 % (ref 0.0–0.2)

## 2021-08-03 LAB — CANCER ANTIGEN 19-9: CA 19-9: 905 U/mL — ABNORMAL HIGH (ref 0–35)

## 2021-08-03 SURGERY — ERCP, WITH INTERVENTION IF INDICATED
Anesthesia: General

## 2021-08-03 MED ORDER — GLUCAGON HCL RDNA (DIAGNOSTIC) 1 MG IJ SOLR
INTRAMUSCULAR | Status: DC | PRN
  Administered 2021-08-03: .5 mg via INTRAVENOUS

## 2021-08-03 MED ORDER — DEXAMETHASONE SODIUM PHOSPHATE 10 MG/ML IJ SOLN
INTRAMUSCULAR | Status: DC | PRN
  Administered 2021-08-03: 5 mg via INTRAVENOUS

## 2021-08-03 MED ORDER — PROPOFOL 10 MG/ML IV BOLUS
INTRAVENOUS | Status: AC
  Filled 2021-08-03: qty 20

## 2021-08-03 MED ORDER — INDOMETHACIN 50 MG RE SUPP
RECTAL | Status: AC
  Filled 2021-08-03: qty 2

## 2021-08-03 MED ORDER — ONDANSETRON HCL 4 MG/2ML IJ SOLN
INTRAMUSCULAR | Status: DC | PRN
Start: 2021-08-03 — End: 2021-08-03
  Administered 2021-08-03: 4 mg via INTRAVENOUS

## 2021-08-03 MED ORDER — LIP MEDEX EX OINT
1.0000 "application " | TOPICAL_OINTMENT | CUTANEOUS | Status: DC | PRN
  Filled 2021-08-03: qty 7

## 2021-08-03 MED ORDER — OXYCODONE HCL 5 MG PO TABS
5.0000 mg | ORAL_TABLET | ORAL | Status: DC | PRN
  Administered 2021-08-03 – 2021-08-04 (×4): 5 mg via ORAL
  Filled 2021-08-03 (×4): qty 1

## 2021-08-03 MED ORDER — GLUCAGON HCL RDNA (DIAGNOSTIC) 1 MG IJ SOLR
INTRAMUSCULAR | Status: AC
  Filled 2021-08-03: qty 1

## 2021-08-03 MED ORDER — PROPOFOL 10 MG/ML IV BOLUS
INTRAVENOUS | Status: DC | PRN
Start: 2021-08-03 — End: 2021-08-03
  Administered 2021-08-03: 100 mg via INTRAVENOUS

## 2021-08-03 MED ORDER — ROCURONIUM BROMIDE 10 MG/ML (PF) SYRINGE
PREFILLED_SYRINGE | INTRAVENOUS | Status: DC | PRN
Start: 2021-08-03 — End: 2021-08-03
  Administered 2021-08-03: 60 mg via INTRAVENOUS

## 2021-08-03 MED ORDER — LIDOCAINE 2% (20 MG/ML) 5 ML SYRINGE
INTRAMUSCULAR | Status: DC | PRN
  Administered 2021-08-03: 100 mg via INTRAVENOUS

## 2021-08-03 MED ORDER — SUGAMMADEX SODIUM 200 MG/2ML IV SOLN
INTRAVENOUS | Status: DC | PRN
  Administered 2021-08-03: 200 mg via INTRAVENOUS

## 2021-08-03 MED ORDER — CIPROFLOXACIN IN D5W 400 MG/200ML IV SOLN
INTRAVENOUS | Status: AC
  Filled 2021-08-03: qty 200

## 2021-08-03 MED ORDER — PHENYLEPHRINE 40 MCG/ML (10ML) SYRINGE FOR IV PUSH (FOR BLOOD PRESSURE SUPPORT)
PREFILLED_SYRINGE | INTRAVENOUS | Status: DC | PRN
Start: 2021-08-03 — End: 2021-08-03
  Administered 2021-08-03: 160 ug via INTRAVENOUS
  Administered 2021-08-03: 200 ug via INTRAVENOUS

## 2021-08-03 MED ORDER — MORPHINE SULFATE (PF) 2 MG/ML IV SOLN
1.0000 mg | INTRAVENOUS | Status: DC | PRN
  Administered 2021-08-03 (×3): 1 mg via INTRAVENOUS
  Filled 2021-08-03 (×3): qty 1

## 2021-08-03 MED ORDER — FENTANYL CITRATE (PF) 100 MCG/2ML IJ SOLN
INTRAMUSCULAR | Status: AC
  Filled 2021-08-03: qty 2

## 2021-08-03 MED ORDER — FENTANYL CITRATE (PF) 100 MCG/2ML IJ SOLN
25.0000 ug | Freq: Once | INTRAMUSCULAR | Status: AC
  Administered 2021-08-03: 25 ug via INTRAVENOUS

## 2021-08-03 MED ORDER — SODIUM CHLORIDE 0.9 % IV SOLN
INTRAVENOUS | Status: DC | PRN
  Administered 2021-08-03: 10 mL

## 2021-08-03 MED ORDER — CIPROFLOXACIN IN D5W 400 MG/200ML IV SOLN
INTRAVENOUS | Status: DC | PRN
  Administered 2021-08-03: 400 mg via INTRAVENOUS

## 2021-08-03 MED ORDER — INDOMETHACIN 50 MG RE SUPP
RECTAL | Status: DC | PRN
  Administered 2021-08-03: 100 mg via RECTAL

## 2021-08-03 NOTE — Progress Notes (Signed)
PROGRESS NOTE    Annette Key  WCB:[id number] DOB: 1941/04/29 DOA: 08/01/2021 PCP: Derinda Late, MD    Brief Narrative:  81 y.o. female with no significant past medical history presents with complaint of abdominal pain, gray stools, fatigue and jaundice.  She reports that for the last few weeks she has been having intermittent mid upper and right upper quadrant abdominal pain that has progressively worsened.  She reports she has a decreased appetite and decreased energy level over the last week.  Her husband noticed her skin today being very yellow and with her abdominal pain brought her to the emergency room.  She has been having nausea for the past week or 2.  She states that her stools became gray to white which concerned her.  Ultrasound showed biliary dilatation with a questionable mass in the pancreatic head.  CT of the abdomen confirmed biliary dilatation but cannot characterize the pancreas well.  MRCP noted pancreatic head mass. GI was consulted  Assessment & Plan:   Principal Problem:   Biliary obstruction Active Problems:   Jaundice   RUQ pain   Transaminitis   Pancreatic mass   Hyponatremia  Principal Problem:   Biliary obstruction with pancreatic head mass MRCP reviewed with pt and family. Concerns of pancreatic head mass GI consulted and pt underwent ERCP 1/17. Findings of mass at pancreatic head with biliary dilatation s/p stent placement and sphincterotomy. Biopsy obtained, pending CA19-9 elevated at 905 Have notified Oncology of pt's visit   Active Problems:   Transaminitis LFTs elevated due to biliary obstruction Remains stable Recheck cmp in AM     Pancreatic mass Confirmed on MRCP and ERCP GI following, now s/p biopsy, stent placement, sphincterotomy     Jaundice Due to biliary obstruction.  IVF hydration with LR at 100 ml/hr Recheck cmp in AM       Hyponatremia Mild. Secondary to dehydration with poor po intake recently -Normalized with IVF  hydration -Repeat bmet in AM      Metastatic lung disease Multiple nodules in the right base of lung on CT abd CT chest performed and results reviewed with pt and family. Innumerable nodules noted on chest CT   DVT prophylaxis: SCD's Code Status: Full Family Communication: Pt in room, family at bedside  Status is: Inpatient  Continue inpatient stay because: severity of illness   Consultants:  GI Oncology  Procedures:    Antimicrobials: Anti-infectives (From admission, onward)    None       Subjective: Reports some upper quadrant pain, attributes to being NPO this AM in anticipation for ERCP  Objective: Vitals:   08/03/21 1630 08/03/21 1640 08/03/21 1650 08/03/21 1700  BP: (!) 162/74 (!) 153/70 (!) 154/67 (!) 152/75  Pulse: 74 80 78 79  Resp: (!) 22 17 17 15   Temp:      TempSrc:      SpO2: 97% 95% 98% 97%   No intake or output data in the 24 hours ending 08/03/21 1802  There were no vitals filed for this visit.  Examination: General exam: Conversant, in no acute distress Respiratory system: normal chest rise, clear, no audible wheezing Cardiovascular system: regular rhythm, s1-s2 Gastrointestinal system: Nondistended, nontender, pos BS Central nervous system: No seizures, no tremors Extremities: No cyanosis, no joint deformities Skin: No rashes, jaundice Psychiatry: Affect normal // no auditory hallucinations   Data Reviewed: I have personally reviewed following labs and imaging studies  CBC: Recent Labs  Lab 08/01/21 1444 08/02/21 0510 08/03/21 6160  WBC 9.2 7.2 6.0  HGB 14.2 12.4 12.3  HCT 42.7 38.0 37.3  MCV 89.0 90.9 91.0  PLT 289 237 759    Basic Metabolic Panel: Recent Labs  Lab 08/01/21 1433 08/02/21 0510 08/03/21 0512  NA 132* 134* 136  K 4.0 4.3 4.6  CL 95* 100 102  CO2 25 26 29   GLUCOSE 135* 112* 113*  BUN 16 14 9   CREATININE 0.86 0.62 0.56  CALCIUM 9.6 8.8* 9.0    GFR: CrCl cannot be calculated (Unknown ideal  weight.). Liver Function Tests: Recent Labs  Lab 08/01/21 1433 08/02/21 0510 08/03/21 0512  AST 571* 521* 501*  ALT 1,361* 1,130* 1,097*  ALKPHOS 380* 278* 297*  BILITOT 12.6* 10.2* 10.4*  PROT 7.1 6.3* 6.2*  ALBUMIN 4.2 3.1* 3.1*    Recent Labs  Lab 08/01/21 1444  LIPASE 26    No results for input(s): AMMONIA in the last 168 hours. Coagulation Profile: Recent Labs  Lab 08/01/21 1444  INR 1.0    Cardiac Enzymes: No results for input(s): CKTOTAL, CKMB, CKMBINDEX, TROPONINI in the last 168 hours. BNP (last 3 results) No results for input(s): PROBNP in the last 8760 hours. HbA1C: No results for input(s): HGBA1C in the last 72 hours. CBG: No results for input(s): GLUCAP in the last 168 hours. Lipid Profile: No results for input(s): CHOL, HDL, LDLCALC, TRIG, CHOLHDL, LDLDIRECT in the last 72 hours. Thyroid Function Tests: No results for input(s): TSH, T4TOTAL, FREET4, T3FREE, THYROIDAB in the last 72 hours. Anemia Panel: No results for input(s): VITAMINB12, FOLATE, FERRITIN, TIBC, IRON, RETICCTPCT in the last 72 hours. Sepsis Labs: No results for input(s): PROCALCITON, LATICACIDVEN in the last 168 hours.  Recent Results (from the past 240 hour(s))  Resp Panel by RT-PCR (Flu A&B, Covid) Nasopharyngeal Swab     Status: None   Collection Time: 08/01/21  3:24 PM   Specimen: Nasopharyngeal Swab; Nasopharyngeal(NP) swabs in vial transport medium  Result Value Ref Range Status   SARS Coronavirus 2 by RT PCR NEGATIVE NEGATIVE Final    Comment: (NOTE) SARS-CoV-2 target nucleic acids are NOT DETECTED.  The SARS-CoV-2 RNA is generally detectable in upper respiratory specimens during the acute phase of infection. The lowest concentration of SARS-CoV-2 viral copies this assay can detect is 138 copies/mL. A negative result does not preclude SARS-Cov-2 infection and should not be used as the sole basis for treatment or other patient management decisions. A negative result may  occur with  improper specimen collection/handling, submission of specimen other than nasopharyngeal swab, presence of viral mutation(s) within the areas targeted by this assay, and inadequate number of viral copies(<138 copies/mL). A negative result must be combined with clinical observations, patient history, and epidemiological information. The expected result is Negative.  Fact Sheet for Patients:  EntrepreneurPulse.com.au  Fact Sheet for Healthcare Providers:  IncredibleEmployment.be  This test is no t yet approved or cleared by the [place] FDA and  has been authorized for detection and/or diagnosis of SARS-CoV-2 by FDA under an Emergency Use Authorization (EUA). This EUA will remain  in effect (meaning this test can be used) for the duration of the COVID-19 declaration under Section 564(b)(1) of the Act, 21 U.S.C.section 360bbb-3(b)(1), unless the authorization is terminated  or revoked sooner.       Influenza A by PCR NEGATIVE NEGATIVE Final   Influenza B by PCR NEGATIVE NEGATIVE Final    Comment: (NOTE) The Xpert Xpress SARS-CoV-2/FLU/RSV plus assay is intended as an aid in the diagnosis of  influenza from Nasopharyngeal swab specimens and should not be used as a sole basis for treatment. Nasal washings and aspirates are unacceptable for Xpert Xpress SARS-CoV-2/FLU/RSV testing.  Fact Sheet for Patients: EntrepreneurPulse.com.au  Fact Sheet for Healthcare Providers: IncredibleEmployment.be  This test is not yet approved or cleared by the [place] FDA and has been authorized for detection and/or diagnosis of SARS-CoV-2 by FDA under an Emergency Use Authorization (EUA). This EUA will remain in effect (meaning this test can be used) for the duration of the COVID-19 declaration under Section 564(b)(1) of the Act, 21 U.S.C. section 360bbb-3(b)(1), unless the authorization is terminated  or revoked.  Performed at [facility], [address], [place], [place] [address]       Radiology Studies: CT CHEST W CONTRAST  Result Date: 08/02/2021 CLINICAL DATA:  Presumed new diagnosis pancreatic cancer staging, pulmonary nodules EXAM: CT CHEST WITH CONTRAST TECHNIQUE: Multidetector CT imaging of the chest was performed during intravenous contrast administration. RADIATION DOSE REDUCTION: This exam was performed according to the departmental dose-optimization program which includes automated exposure control, adjustment of the mA and/or kV according to patient size and/or use of iterative reconstruction technique. CONTRAST:  35mL OMNIPAQUE IOHEXOL 300 MG/ML  SOLN COMPARISON:  CT abdomen pelvis, 08/01/2021 FINDINGS: Cardiovascular: Aortic atherosclerosis. Normal heart size. Scattered coronary artery calcifications. No pericardial effusion. Mediastinum/Nodes: No enlarged mediastinal, hilar, or axillary lymph nodes. Thyroid gland, trachea, and esophagus demonstrate no significant findings. Lungs/Pleura: Innumerable bilateral pulmonary nodules of varying sizes, largest in the right lung base measuring 1.5 x 1.4 cm (series 5, image 111). Additional index nodule of the posterior right apex measures 1.3 x 1.2 cm (series 5, image 33). No pleural effusion or pneumothorax. Upper Abdomen: No acute abnormality. Biliary and pancreatic ductal dilatation partially imaged masslike lesion of the pancreatic head better assessed by prior dedicated examination of the abdomen. Musculoskeletal: No chest wall abnormality. No suspicious osseous lesions identified. IMPRESSION: 1. Innumerable bilateral pulmonary nodules of varying sizes, consistent with metastatic disease. 2. Biliary and pancreatic ductal dilatation as well as partially imaged masslike lesion of the pancreatic head, better assessed by prior dedicated examinations of the abdomen. 3. Coronary artery disease. Aortic Atherosclerosis  (ICD10-I70.0). Electronically Signed   By: Delanna Ahmadi M.D.   On: 08/02/2021 13:10   MR 3D Recon At Scanner  Result Date: 08/02/2021 CLINICAL DATA:  Nausea, vomiting, biliary ductal dilatation, pancreatic ductal dilatation, possible pancreatic head mass EXAM: MRI ABDOMEN WITHOUT AND WITH CONTRAST (INCLUDING MRCP) TECHNIQUE: Multiplanar multisequence MR imaging of the abdomen was performed both before and after the administration of intravenous contrast. Heavily T2-weighted images of the biliary and pancreatic ducts were obtained, and three-dimensional MRCP images were rendered by post processing. CONTRAST:  68mL GADAVIST GADOBUTROL 1 MMOL/ML IV SOLN COMPARISON:  CT abdomen pelvis, 08/01/2021 FINDINGS: Examination is generally limited by breath motion artifact throughout Lower chest: Numerous pulmonary nodules throughout the included bilateral lung bases, poorly evaluated by MR. Hepatobiliary: No mass or other parenchymal abnormality identified. No gallstones. Moderate intra and extrahepatic biliary ductal dilatation, the common bile duct measuring up to 0.9 cm. The common bile duct is abruptly truncated in the superior pancreatic head. Pancreas: Masslike, slightly hypoenhancing appearance of the pancreatic head, measuring approximately 3.5 x 2.2 cm (series 3, image 20), with abrupt truncation of the pancreatic duct in the superior pancreatic head, the pancreatic duct dilated distally up to 0.7 cm, with atrophy of the distal pancreatic parenchyma.No pancreatic ductal dilatation. Spleen:  Within normal limits in size  and appearance. Adrenals/Urinary Tract: Normal adrenal glands. No renal masses or suspicious contrast enhancement identified. No evidence of hydronephrosis. Stomach/Bowel: Visualized portions within the abdomen are unremarkable. Vascular/Lymphatic: No discretely enlarged lymph nodes identified. There is abnormal contrast enhancing soft tissue about the celiac axis (series 21, image 39). No abdominal  aortic aneurysm demonstrated. Breath motion artifact significantly limits evaluation of the vessels in the vicinity of suspected pancreatic head mass; particularly the superior mesenteric vein, central splenic vein, and portal vein are not meaningfully assessed on this examination but were patent on prior CT dated 08/01/2021. Other:  None. Musculoskeletal: No suspicious osseous lesions identified. IMPRESSION: 1. Masslike, slightly hypoenhancing appearance of the pancreatic head, measuring approximately 3.5 x 2.2 cm, with abrupt truncation of the common bile duct and pancreatic duct in the superior pancreatic head, highly suspicious for pancreatic adenocarcinoma. 2. There are no discretely enlarged lymph nodes, however there is abnormal contrast enhancing soft tissue about the celiac axis, suspicious for nodal metastatic disease. 3. Numerous pulmonary nodules throughout the included bilateral lung bases, poorly evaluated by MR, better seen by prior CT, however these remain highly suspicious for pulmonary metastases. 4. Due to breath motion artifact, the vascular structures, particularly the superior mesenteric, central splenic, and portal veins are poorly assessed, but were patent on prior CT. 5. Due to significant limitations of breath motion artifact on this examination, consider contrast enhanced CT for future follow-up and restaging imaging by CT. Electronically Signed   By: Delanna Ahmadi M.D.   On: 08/02/2021 09:35   DG ERCP WITH SPHINCTEROTOMY  Result Date: 08/03/2021 CLINICAL DATA:  Probable malignant obstructed jaundice secondary to pancreatic mass EXAM: ERCP TECHNIQUE: Multiple spot images obtained with the fluoroscopic device and submitted for interpretation post-procedure. FLUOROSCOPY TIME:  Fluoroscopy Time:  5 minutes 44 seconds reported Number of Acquired Spot Images: 0 COMPARISON:  MRCP 08/02/2021 FINDINGS: A total of 14 saved intraoperative images are submitted for review. The images depict a  flexible duodenal scope in the descending duodenum with wire cannulation of the common bile duct. Mild biliary ductal dilatation with complete occlusion distally. On the final images, a self expanding biliary stent has been placed and is partially opened. IMPRESSION: 1. Complete obstruction of the distal common bile duct. 2. Successful placement of self expanding metallic biliary stent. These images were submitted for radiologic interpretation only. Please see the procedural report for the amount of contrast and the fluoroscopy time utilized. Electronically Signed   By: Jacqulynn Cadet M.D.   On: 08/03/2021 16:30   DG C-Arm 1-60 Min-No Report  Result Date: 08/03/2021 Fluoroscopy was utilized by the requesting physician.  No radiographic interpretation.   MR ABDOMEN MRCP W WO CONTAST  Result Date: 08/02/2021 CLINICAL DATA:  Nausea, vomiting, biliary ductal dilatation, pancreatic ductal dilatation, possible pancreatic head mass EXAM: MRI ABDOMEN WITHOUT AND WITH CONTRAST (INCLUDING MRCP) TECHNIQUE: Multiplanar multisequence MR imaging of the abdomen was performed both before and after the administration of intravenous contrast. Heavily T2-weighted images of the biliary and pancreatic ducts were obtained, and three-dimensional MRCP images were rendered by post processing. CONTRAST:  58mL GADAVIST GADOBUTROL 1 MMOL/ML IV SOLN COMPARISON:  CT abdomen pelvis, 08/01/2021 FINDINGS: Examination is generally limited by breath motion artifact throughout Lower chest: Numerous pulmonary nodules throughout the included bilateral lung bases, poorly evaluated by MR. Hepatobiliary: No mass or other parenchymal abnormality identified. No gallstones. Moderate intra and extrahepatic biliary ductal dilatation, the common bile duct measuring up to 0.9 cm. The common bile duct is abruptly  truncated in the superior pancreatic head. Pancreas: Masslike, slightly hypoenhancing appearance of the pancreatic head, measuring  approximately 3.5 x 2.2 cm (series 3, image 20), with abrupt truncation of the pancreatic duct in the superior pancreatic head, the pancreatic duct dilated distally up to 0.7 cm, with atrophy of the distal pancreatic parenchyma.No pancreatic ductal dilatation. Spleen:  Within normal limits in size and appearance. Adrenals/Urinary Tract: Normal adrenal glands. No renal masses or suspicious contrast enhancement identified. No evidence of hydronephrosis. Stomach/Bowel: Visualized portions within the abdomen are unremarkable. Vascular/Lymphatic: No discretely enlarged lymph nodes identified. There is abnormal contrast enhancing soft tissue about the celiac axis (series 21, image 39). No abdominal aortic aneurysm demonstrated. Breath motion artifact significantly limits evaluation of the vessels in the vicinity of suspected pancreatic head mass; particularly the superior mesenteric vein, central splenic vein, and portal vein are not meaningfully assessed on this examination but were patent on prior CT dated 08/01/2021. Other:  None. Musculoskeletal: No suspicious osseous lesions identified. IMPRESSION: 1. Masslike, slightly hypoenhancing appearance of the pancreatic head, measuring approximately 3.5 x 2.2 cm, with abrupt truncation of the common bile duct and pancreatic duct in the superior pancreatic head, highly suspicious for pancreatic adenocarcinoma. 2. There are no discretely enlarged lymph nodes, however there is abnormal contrast enhancing soft tissue about the celiac axis, suspicious for nodal metastatic disease. 3. Numerous pulmonary nodules throughout the included bilateral lung bases, poorly evaluated by MR, better seen by prior CT, however these remain highly suspicious for pulmonary metastases. 4. Due to breath motion artifact, the vascular structures, particularly the superior mesenteric, central splenic, and portal veins are poorly assessed, but were patent on prior CT. 5. Due to significant limitations  of breath motion artifact on this examination, consider contrast enhanced CT for future follow-up and restaging imaging by CT. Electronically Signed   By: Delanna Ahmadi M.D.   On: 08/02/2021 09:35    Scheduled Meds: Continuous Infusions:  lactated ringers 100 mL/hr at 08/03/21 1409     LOS: 1 day   Marylu Lund, MD [facility] Pager On Amion  If 7PM-7AM, please contact night-coverage 08/03/2021, 6:02 PM

## 2021-08-03 NOTE — Anesthesia Procedure Notes (Signed)
Procedure Name: Intubation Date/Time: 08/03/2021 2:17 PM Performed by: Rosaland Lao, CRNA Pre-anesthesia Checklist: Patient identified, Emergency Drugs available, Suction available and Patient being monitored Patient Re-evaluated:Patient Re-evaluated prior to induction Oxygen Delivery Method: Circle system utilized Preoxygenation: Pre-oxygenation with 100% oxygen Induction Type: IV induction Ventilation: Mask ventilation without difficulty Laryngoscope Size: Miller and 2 Grade View: Grade III Tube type: Oral Number of attempts: 1 Airway Equipment and Method: Stylet Placement Confirmation: ETT inserted through vocal cords under direct vision, positive ETCO2 and breath sounds checked- equal and bilateral Secured at: 22 cm Tube secured with: Tape Dental Injury: Teeth and Oropharynx as per pre-operative assessment  Difficulty Due To: Difficulty was unanticipated and Difficult Airway- due to anterior larynx

## 2021-08-03 NOTE — Transfer of Care (Signed)
Immediate Anesthesia Transfer of Care Note  Patient: Annette Key  Procedure(s) Performed: ENDOSCOPIC RETROGRADE CHOLANGIOPANCREATOGRAPHY (ERCP) UPPER ENDOSCOPIC ULTRASOUND (EUS) LINEAR ESOPHAGOGASTRODUODENOSCOPY (EGD)  Patient Location: PACU and Endoscopy Unit  Anesthesia Type:General  Level of Consciousness: awake, alert  and oriented  Airway & Oxygen Therapy: Patient Spontanous Breathing and Patient connected to face mask  Post-op Assessment: Report given to RN and Post -op Vital signs reviewed and stable  Post vital signs: Reviewed and stable  Last Vitals:  Vitals Value Taken Time  BP    Temp    Pulse    Resp    SpO2      Last Pain:  Vitals:   08/03/21 1303  TempSrc: Temporal  PainSc: 0-No pain      Patients Stated Pain Goal: 3 (57/90/38 3338)  Complications: No notable events documented.

## 2021-08-03 NOTE — Op Note (Signed)
[facility] Patient Name: Annette Key Procedure Date: 08/03/2021 MRN: [id number] Attending MD: Carol Ada , MD Date of Birth: 1940/09/22 CSN: [id number] Age: 81 Admit Type: Outpatient Procedure:                ERCP Indications:              Malignant tumor of the head of pancreas Providers:                Carol Ada, MD, Burtis Junes, RN, Tyna Jaksch                            Technician, Despina Pole, Technician, Margurite Auerbach Referring MD:              Medicines:                General Anesthesia Complications:            No immediate complications. Estimated Blood Loss:     Estimated blood loss: none. Procedure:                Pre-Anesthesia Assessment:                           - Prior to the procedure, a History and Physical                            was performed, and patient medications and                            allergies were reviewed. The patient's tolerance of                            previous anesthesia was also reviewed. The risks                            and benefits of the procedure and the sedation                            options and risks were discussed with the patient.                            All questions were answered, and informed consent                            was obtained. Prior Anticoagulants: The patient has                            taken Lovenox (enoxaparin), last dose was 1 day                            prior to procedure. ASA Grade Assessment: III - A  patient with severe systemic disease. After                            reviewing the risks and benefits, the patient was                            deemed in satisfactory condition to undergo the                            procedure.                           - Sedation was administered by an anesthesia                            professional. General anesthesia was attained.                           After  obtaining informed consent, the scope was                            passed under direct vision. Throughout the                            procedure, the patient's blood pressure, pulse, and                            oxygen saturations were monitored continuously. The                            [id number] Olympus linear ultrasound scope                            was introduced through the mouth, and used to                            inject contrast into and used to inject contrast                            into the bile duct and ventral pancreatic duct. The                            [id number]-[id number] ([id number] Olympus duodenoscope was                            introduced through the mouth, and used to inject                            contrast into and used to inject contrast into the                            bile duct and dorsal pancreatic duct. The ERCP was  technically difficult and complex. The patient                            tolerated the procedure well. Scope In: Scope Out: Findings:      An echoendoscope was used for examination. A round mass was identified       in the pancreatic head. The mass was hypoechoic and hyperechoic. The       mass measured 32 mm by 22 mm in maximal cross-sectional diameter. The       outer margins were irregular. The remainder of the pancreas was       examined. The endosonographic appearance of parenchyma and the upstream       pancreatic duct indicated duct dilation and a maximum duct diameter of 8       mm. There was dilation in the common bile duct, in the main bile duct       and diffusely throughout the intrahepatic bile duct(s) which measured up       to 9 mm. The major papilla was normal. The bile duct was deeply       cannulated with the short-nosed traction sphincterotome. Contrast was       injected. I personally interpreted the bile duct images. There was brisk       flow of contrast through the ducts.  Image quality was excellent.       Contrast extended to the entire biliary tree. The entire biliary tree       was diffusely dilated, with a mass causing an obstruction. The largest       diameter was 9 mm. A short 0.035 inch Superstiff Antonietta Breach was passed into       the biliary tree. An 8 mm biliary sphincterotomy was made with a       monofilament traction (standard) sphincterotome using ERBE       electrocautery. There was no post-sphincterotomy bleeding. One 10 mm by       4 cm covered metal stent was placed 4.5 cm into the common bile duct.       Bile flowed through the stent. The stent was in good position.      The EUS was initially performed. A large pancreatic head mass was found       and it measured 32 x 22 mm. The mass exhibited mixed echogenicity and it       was irregularly bordered. It was adjacent to the [facility] and some       irregularity was noted, but it was not felt to be invading into the PC.       The PD was markedly dilated at 8.7 mm in the body and the CBD was       measured to be 9 mm in the mid portion. There was an abrupt cut off in       the distal CBD. Five passes with the FNA needle were performed and only       scant material was obtained. The fourth and fifth passes were obtained       for the cell block. The celiac axis appeared normal, i.e., no evidence       of tumor involvement. The left lob of the liver exhibited significant       biliary ductal dilation. After the FNA the procedure was converted to an       ERCP. There was a stenosis between D1  and D2, but tip deflection allowed       for passage of the duodenoscope. The PD was cannulated multiple times       and a two wire technique was used. With persistence, and after removing       the Revolution PD wire, the CBD was cannulated. Contrast injection       revealed a dilated CBD at approximately 10 mm and there was a focal       stricture from the mass in the distal CBD. A 10 mm x 40 mm covered        metallic stent was placed under fluoroscopy and excellent placement was       obtained. Clear bile was noted to be draining after stent deployment. At       this time the procedure was terminated. Impression:               - A mass was identified in the pancreatic head.                           - There was dilation in the common bile duct, in                            the entire main bile duct and in the intrahepatic                            bile ducts, diffusely which measured up to 9 mm.                           - The major papilla appeared normal.                           - The entire biliary tree was dilated, with a mass                            causing an obstruction.                           - A biliary sphincterotomy was performed.                           - One covered metal stent was placed into the                            common bile duct. Moderate Sedation:      Not Applicable - Patient had care per Anesthesia. Recommendation:           - Return patient to hospital ward for ongoing care.                           - Resume Lovenox tomorrow.                           - If stable she can be discharged home tomorrow.                           - Oncology consultation.                           -  Await final pathology for FNA. Procedure Code(s):        --- Professional ---                           [phone], Endoscopic retrograde                            cholangiopancreatography (ERCP); with placement of                            endoscopic stent into biliary or pancreatic duct,                            including pre- and post-dilation and guide wire                            passage, when performed, including sphincterotomy,                            when performed, each stent                           [id number], Esophagogastroduodenoscopy, flexible,                            transoral; with endoscopic ultrasound examination                            limited to the  esophagus, stomach or duodenum, and                            adjacent structures                           74328, Endoscopic catheterization of the biliary                            ductal system, radiological supervision and                            interpretation Diagnosis Code(s):        --- Professional ---                           K86.89, Other specified diseases of pancreas                           K83.1, Obstruction of bile duct                           C25.0, Malignant neoplasm of head of pancreas                           K83.8, Other specified diseases of biliary tract CPT copyright 2019 [employer]. All rights reserved. The codes documented in this report are preliminary and upon coder review may  be revised to meet current compliance requirements. Carol Ada,  MD Carol Ada, MD 08/03/2021 4:37:21 PM This report has been signed electronically. Number of Addenda: 0

## 2021-08-03 NOTE — Anesthesia Preprocedure Evaluation (Signed)
Anesthesia Evaluation  Patient identified by MRN, date of birth, ID band Patient awake    Reviewed: Allergy & Precautions, NPO status , Patient's Chart, lab work & pertinent test results  Airway Mallampati: II  TM Distance: >3 FB Neck ROM: Full    Dental no notable dental hx.    Pulmonary neg pulmonary ROS,    Pulmonary exam normal breath sounds clear to auscultation       Cardiovascular negative cardio ROS Normal cardiovascular exam Rhythm:Regular Rate:Normal     Neuro/Psych Anxiety negative neurological ROS     GI/Hepatic GERD  Medicated,Pancreatic head mass Markedly increased LFT's   Endo/Other  negative endocrine ROS  Renal/GU negative Renal ROS  negative genitourinary   Musculoskeletal negative musculoskeletal ROS (+)   Abdominal   Peds negative pediatric ROS (+)  Hematology negative hematology ROS (+)   Anesthesia Other Findings   Reproductive/Obstetrics negative OB ROS                             Anesthesia Physical Anesthesia Plan  ASA: 3  Anesthesia Plan: General   Post-op Pain Management:    Induction: Intravenous  PONV Risk Score and Plan: 3 and Ondansetron, Dexamethasone and Treatment may vary due to age or medical condition  Airway Management Planned: Oral ETT  Additional Equipment:   Intra-op Plan:   Post-operative Plan: Extubation in OR  Informed Consent: I have reviewed the patients History and Physical, chart, labs and discussed the procedure including the risks, benefits and alternatives for the proposed anesthesia with the patient or authorized representative who has indicated his/her understanding and acceptance.     Dental advisory given  Plan Discussed with: CRNA and Surgeon  Anesthesia Plan Comments:         Anesthesia Quick Evaluation

## 2021-08-03 NOTE — Interval H&P Note (Signed)
History and Physical Interval Note:  08/03/2021 1:59 PM  Annette Key  has presented today for surgery, with the diagnosis of Biliary obstruction.  The various methods of treatment have been discussed with the patient and family. After consideration of risks, benefits and other options for treatment, the patient has consented to  Procedure(s): ENDOSCOPIC RETROGRADE CHOLANGIOPANCREATOGRAPHY (ERCP) (N/A) UPPER ENDOSCOPIC ULTRASOUND (EUS) LINEAR (N/A) ESOPHAGOGASTRODUODENOSCOPY (EGD) (N/A) as a surgical intervention.  The patient's history has been reviewed, patient examined, no change in status, stable for surgery.  I have reviewed the patient's chart and labs.  Questions were answered to the patient's satisfaction.     [name] D

## 2021-08-04 ENCOUNTER — Encounter (HOSPITAL_COMMUNITY): Payer: Self-pay | Admitting: Gastroenterology

## 2021-08-04 ENCOUNTER — Other Ambulatory Visit: Payer: Self-pay | Admitting: Hematology and Oncology

## 2021-08-04 DIAGNOSIS — R918 Other nonspecific abnormal finding of lung field: Secondary | ICD-10-CM

## 2021-08-04 DIAGNOSIS — K831 Obstruction of bile duct: Secondary | ICD-10-CM | POA: Diagnosis not present

## 2021-08-04 DIAGNOSIS — K8689 Other specified diseases of pancreas: Secondary | ICD-10-CM

## 2021-08-04 LAB — COMPREHENSIVE METABOLIC PANEL
ALT: 960 U/L — ABNORMAL HIGH (ref 0–44)
AST: 386 U/L — ABNORMAL HIGH (ref 15–41)
Albumin: 3.1 g/dL — ABNORMAL LOW (ref 3.5–5.0)
Alkaline Phosphatase: 264 U/L — ABNORMAL HIGH (ref 38–126)
Anion gap: 7 (ref 5–15)
BUN: 10 mg/dL (ref 8–23)
CO2: 27 mmol/L (ref 22–32)
Calcium: 8.7 mg/dL — ABNORMAL LOW (ref 8.9–10.3)
Chloride: 99 mmol/L (ref 98–111)
Creatinine, Ser: 0.75 mg/dL (ref 0.44–1.00)
GFR, Estimated: >60 mL/min (ref >60)
Glucose, Bld: 112 mg/dL — ABNORMAL HIGH (ref 70–99)
Potassium: 4 mmol/L (ref 3.5–5.1)
Sodium: 133 mmol/L — ABNORMAL LOW (ref 135–145)
Total Bilirubin: 5.8 mg/dL — ABNORMAL HIGH (ref 0.3–1.2)
Total Protein: 5.9 g/dL — ABNORMAL LOW (ref 6.5–8.1)

## 2021-08-04 LAB — CBC
HCT: 34.7 % — ABNORMAL LOW (ref 36.0–46.0)
Hemoglobin: 11.6 g/dL — ABNORMAL LOW (ref 12.0–15.0)
MCH: 29.7 pg (ref 26.0–34.0)
MCHC: 33.4 g/dL (ref 30.0–36.0)
MCV: 89 fL (ref 80.0–100.0)
Platelets: 201 10*3/uL (ref 150–400)
RBC: 3.9 MIL/uL (ref 3.87–5.11)
RDW: 15.4 % (ref 11.5–15.5)
WBC: 9.8 10*3/uL (ref 4.0–10.5)
nRBC: 0 % (ref 0.0–0.2)

## 2021-08-04 LAB — CYTOLOGY - NON PAP

## 2021-08-04 MED ORDER — LORAZEPAM 1 MG PO TABS: 1.0000 mg | ORAL_TABLET | Freq: Two times a day (BID) | ORAL | 0 refills | Status: AC | PRN

## 2021-08-04 MED ORDER — OXYCODONE HCL 5 MG PO TABS: 5.0000 mg | ORAL_TABLET | ORAL | 0 refills | Status: AC | PRN

## 2021-08-04 NOTE — Consult Note (Signed)
[place]  [phone] Fax:([phone]) [place]  Referral MD  Reason for Referral: Pancreatic mass, lung nodules suspicious for metastatic cancer  Chief Complaint  Patient presents with   Abdominal Pain    HPI:    This is a pleasant 81 year old female patient with past medical history significant for anxiety who is here with chief complaint of abdominal pain.  Patient cannot quite remember when her abdominal pain started but she tells me that it was not there for too long.  She has noticed abdominal pain, gray stools and was a yellow and hence brought to the hospital.  She apparently does not like to come to doctors not take any medications.  She does follow a psychiatrist at [facility] regularly and has good rapport with him.  She does complain of weight loss but is unable to quantitate it, does complain of low energy and low appetite for the past couple weeks at least.  Family noticed that her skin is very yellow and hence brought her to the hospital.  Upon admission she had imaging, ultrasound abdomen which showed common bile duct measuring 7 mm and there is mild central intrahepatic bile duct dilatation, distal common bile duct not well visualized cannot exclude a distal duct stone or other obstructing lesion consider MRCP given the jaundice.  CT abdomen pelvis on Aug 01 2021 noticed diffuse intrahepatic biliary ductal dilatation with mild distention of the proximal common bile duct, distal common bile duct is difficult to visualize.  Bulbous heterogeneous appearance of the head of the pancreas.  Cystic dilation of the main pancreatic duct in the body and head of the pancreas. Solid pulmonary nodules versus area of consolidation in the right lower lobe largest measuring 1.5 cm multiple smaller solid subpleural pulmonary nodules, metastatic disease cannot be excluded. Dedicated CT chest with contrast showed bilateral  innumerable lung nodules concerning for pulmonary metastasis. MRI abdomen showed masslike hypoenhancing appearance of pancreatic head measuring approximately 3.5 x 2.2 with abrupt truncation of the common bile duct and pancreatic duct highly suspicious for pancreatic adenocarcinoma, no discretely enlarged lymph nodes however abnormal contrast-enhancing soft tissue about the celiac axis suspicious for nodal metastatic disease, numerous pulmonary nodules throughout the included bilateral lung bases highly suspicious for pulmonary metastasis. Cytology obtained from a pancreatic head suspicious for malignancy.  Family was at bedside at the time of our discussion, son Mr. Dannemiller is a retired Pharmacist, community and her husband was also present at the time of the appointment.  Patient mentions that she does not want to try any chemotherapy, she has never been a big taker of medication and would not like to go through that.  She had a sister who died from metastatic pancreatic cancer who went down in days and had tried chemotherapy. She denies any other family history of cancer.   Past Medical History:  Diagnosis Date   Allergic rhinitis    Hypotension 04/16/2020  :   Past Surgical History:  Procedure Laterality Date   BILIARY STENT PLACEMENT N/A 08/03/2021   Procedure: BILIARY STENT PLACEMENT;  Surgeon: Carol Ada, MD;  Location: [facility];  Service: Endoscopy;  Laterality: N/A;   ERCP N/A 08/03/2021   Procedure: ENDOSCOPIC RETROGRADE CHOLANGIOPANCREATOGRAPHY (ERCP);  Surgeon: Carol Ada, MD;  Location: [facility];  Service: Endoscopy;  Laterality: N/A;   ESOPHAGOGASTRODUODENOSCOPY N/A 08/03/2021   Procedure: ESOPHAGOGASTRODUODENOSCOPY (EGD);  Surgeon: Carol Ada, MD;  Location: [facility];  Service: Endoscopy;  Laterality: N/A;  EUS N/A 08/03/2021   Procedure: UPPER ENDOSCOPIC ULTRASOUND (EUS) LINEAR;  Surgeon: Carol Ada, MD;  Location: [facility];  Service: Endoscopy;  Laterality: N/A;    FINE NEEDLE ASPIRATION N/A 08/03/2021   Procedure: FINE NEEDLE ASPIRATION (FNA) LINEAR;  Surgeon: Carol Ada, MD;  Location: [facility];  Service: Endoscopy;  Laterality: N/A;   SPHINCTEROTOMY  08/03/2021   Procedure: SPHINCTEROTOMY;  Surgeon: Carol Ada, MD;  Location: [facility];  Service: Endoscopy;;  :   Current Facility-Administered Medications  Medication Dose Route Frequency Provider Last Rate Last Admin   lactated ringers infusion   Intravenous Continuous Carol Ada, MD 100 mL/hr at 08/04/21 1047 Infusion Verify at 08/04/21 1047   lip balm (CARMEX) ointment 1 application  1 application Topical PRN Donne Hazel, MD       morphine 2 MG/ML injection 1 mg  1 mg Intravenous Q4H PRN Carol Ada, MD   1 mg at 08/03/21 1750   ondansetron (ZOFRAN) tablet 4 mg  4 mg Oral Q6H PRN Carol Ada, MD   4 mg at 08/04/21 1059   Or   ondansetron (ZOFRAN) injection 4 mg  4 mg Intravenous Q6H PRN Carol Ada, MD   4 mg at 08/03/21 0347   oxyCODONE (Oxy IR/ROXICODONE) immediate release tablet 5 mg  5 mg Oral Q4H PRN Carol Ada, MD   5 mg at 08/04/21 1058      Allergies  Allergen Reactions   Sulfa Antibiotics Palpitations  :   Family History  Problem Relation Age of Onset   Hypertension Mother    Throat cancer Father    Cancer Father    Pancreatic cancer Sister    Heart attack Brother    Breast cancer Neg Hx   :   Social History   Socioeconomic History   Marital status: Married    Spouse name: Not on file   Number of children: Not on file   Years of education: Not on file   Highest education level: Not on file  Occupational History   Not on file  Tobacco Use   Smoking status: Never   Smokeless tobacco: Never  Vaping Use   Vaping Use: Never used  Substance and Sexual Activity   Alcohol use: No   Drug use: No   Sexual activity: Not on file  Other Topics Concern   Not on file  Social History Narrative   Not on file   Social Determinants of Health    Financial Resource Strain: Not on file  Food Insecurity: Not on file  Transportation Needs: Not on file  Physical Activity: Not on file  Stress: Not on file  Social Connections: Not on file  Intimate Partner Violence: Not on file   Rest of the pertinent 10 point ROS reviewed and negative.  Exam: Patient Vitals for the past 24 hrs:  BP Temp Temp src Pulse Resp SpO2 Height Weight  08/04/21 1350 140/75 98.9 F (37.2 C) Oral 76 16 -- -- --  08/04/21 0526 117/63 (!) 97.4 F (36.3 C) Oral 74 15 97 % -- --  08/03/21 2117 (!) 114/56 98.2 F (36.8 C) Oral 75 16 97 % -- --  08/03/21 1936 -- -- -- -- -- -- 5\' 4"  (1.626 m) 148 lb (67.1 kg)  08/03/21 1700 (!) 152/75 -- -- 79 15 97 % -- --  08/03/21 1650 (!) 154/67 -- -- 78 17 98 % -- --  08/03/21 1640 (!) 153/70 -- -- 80 17 95 % -- --  08/03/21 1630 (!) 162/74 -- -- 74 (!) 22 97 % -- --  08/03/21 1622 (!) 148/79 -- -- 78 16 97 % -- --  08/03/21 1620 90/70 97.6 F (36.4 C) Temporal 76 20 97 % -- --   Physical Exam Constitutional:      Appearance: She is ill-appearing.  Eyes:     General: Scleral icterus present.  Cardiovascular:     Rate and Rhythm: Normal rate and regular rhythm.  Pulmonary:     Effort: Pulmonary effort is normal.     Breath sounds: Normal breath sounds.  Abdominal:     General: Bowel sounds are normal.     Palpations: Abdomen is soft. There is no mass.     Tenderness: There is abdominal tenderness in the right upper quadrant. There is no guarding.  Skin:    Coloration: Skin is jaundiced.  Neurological:     General: No focal deficit present.     Mental Status: She is alert.  Psychiatric:        Mood and Affect: Mood is anxious.      Lab Results  Component Value Date   WBC 9.8 08/04/2021   HGB 11.6 (L) 08/04/2021   HCT 34.7 (L) 08/04/2021   PLT 201 08/04/2021   GLUCOSE 112 (H) 08/04/2021   ALT 960 (H) 08/04/2021   AST 386 (H) 08/04/2021   NA 133 (L) 08/04/2021   K 4.0 08/04/2021   CL 99  08/04/2021   CREATININE 0.75 08/04/2021   BUN 10 08/04/2021   CO2 27 08/04/2021    CT CHEST W CONTRAST  Result Date: 08/02/2021 CLINICAL DATA:  Presumed new diagnosis pancreatic cancer staging, pulmonary nodules EXAM: CT CHEST WITH CONTRAST TECHNIQUE: Multidetector CT imaging of the chest was performed during intravenous contrast administration. RADIATION DOSE REDUCTION: This exam was performed according to the departmental dose-optimization program which includes automated exposure control, adjustment of the mA and/or kV according to patient size and/or use of iterative reconstruction technique. CONTRAST:  83mL OMNIPAQUE IOHEXOL 300 MG/ML  SOLN COMPARISON:  CT abdomen pelvis, 08/01/2021 FINDINGS: Cardiovascular: Aortic atherosclerosis. Normal heart size. Scattered coronary artery calcifications. No pericardial effusion. Mediastinum/Nodes: No enlarged mediastinal, hilar, or axillary lymph nodes. Thyroid gland, trachea, and esophagus demonstrate no significant findings. Lungs/Pleura: Innumerable bilateral pulmonary nodules of varying sizes, largest in the right lung base measuring 1.5 x 1.4 cm (series 5, image 111). Additional index nodule of the posterior right apex measures 1.3 x 1.2 cm (series 5, image 33). No pleural effusion or pneumothorax. Upper Abdomen: No acute abnormality. Biliary and pancreatic ductal dilatation partially imaged masslike lesion of the pancreatic head better assessed by prior dedicated examination of the abdomen. Musculoskeletal: No chest wall abnormality. No suspicious osseous lesions identified. IMPRESSION: 1. Innumerable bilateral pulmonary nodules of varying sizes, consistent with metastatic disease. 2. Biliary and pancreatic ductal dilatation as well as partially imaged masslike lesion of the pancreatic head, better assessed by prior dedicated examinations of the abdomen. 3. Coronary artery disease. Aortic Atherosclerosis (ICD10-I70.0). Electronically Signed   By: Delanna Ahmadi  M.D.   On: 08/02/2021 13:10   CT ABDOMEN PELVIS W CONTRAST  Result Date: 08/01/2021 CLINICAL DATA:  Nausea vomiting. EXAM: CT ABDOMEN AND PELVIS WITH CONTRAST TECHNIQUE: Multidetector CT imaging of the abdomen and pelvis was performed using the standard protocol following bolus administration of intravenous contrast. RADIATION DOSE REDUCTION: This exam was performed according to the departmental dose-optimization program which includes automated exposure control, adjustment of the mA and/or kV according  to patient size and/or use of iterative reconstruction technique. CONTRAST:  134mL OMNIPAQUE IOHEXOL 300 MG/ML  SOLN COMPARISON:  None. FINDINGS: Lower chest: Solid pulmonary nodules versus areas of consolidation in the right lower lobe, the largest measuring 1.5 cm. Multiple smaller solid subpleural pulmonary nodules in the left lung base. Hepatobiliary: Diffuse intrahepatic biliary ductal dilation. Normal appearance of the gallbladder. Mild distention of the proximal common bile duct. The distal common bile duct is difficult to visualize. Pancreas: Diffuse cystic dilation of the main pancreatic duct involving the body and tail of the pancreas. Bulbous heterogeneous appearance of the head of the pancreas. Spleen: Normal in size without focal abnormality. Adrenals/Urinary Tract: Adrenal glands are unremarkable. Kidneys are normal, without renal calculi, focal lesion, or hydronephrosis. Bladder is unremarkable. Stomach/Bowel: Stomach is within normal limits. Appendix appears normal. No evidence of bowel wall thickening, distention, or inflammatory changes. Vascular/Lymphatic: Aortic atherosclerosis. No enlarged abdominal or pelvic lymph nodes. 9 mm short axis lymph node in the porta hepatic is noted. Reproductive: Uterus and bilateral adnexa are unremarkable. Other: No abdominal wall hernia or abnormality. No abdominopelvic ascites. Musculoskeletal: No acute or significant osseous findings. IMPRESSION: 1. Diffuse  intrahepatic biliary ductal dilation with mild distention of the proximal common bile duct. The distal common bile duct is difficult to visualize. Bulbous heterogeneous appearance of the head of the pancreas. Cystic dilation of the main pancreatic duct in the body and head of the pancreas. Further evaluation with pancreatic mass protocol MRI of the abdomen may be considered. 2. Solid pulmonary nodules versus areas of consolidation in the right lower lobe, the largest measuring 1.5 cm. Multiple smaller solid subpleural pulmonary nodules in the left lung base. Metastatic disease cannot be excluded. Further evaluation with complete chest CT, or PET-CT if the imaging of the abdomen is positive for malignancy, may be considered. 3. 9 mm short axis lymph node in the porta hepatic region, indeterminate. 4. Aortic atherosclerosis. Aortic Atherosclerosis (ICD10-I70.0). Electronically Signed   By: Fidela Salisbury M.D.   On: 08/01/2021 16:35   MR 3D Recon At Scanner  Result Date: 08/02/2021 CLINICAL DATA:  Nausea, vomiting, biliary ductal dilatation, pancreatic ductal dilatation, possible pancreatic head mass EXAM: MRI ABDOMEN WITHOUT AND WITH CONTRAST (INCLUDING MRCP) TECHNIQUE: Multiplanar multisequence MR imaging of the abdomen was performed both before and after the administration of intravenous contrast. Heavily T2-weighted images of the biliary and pancreatic ducts were obtained, and three-dimensional MRCP images were rendered by post processing. CONTRAST:  28mL GADAVIST GADOBUTROL 1 MMOL/ML IV SOLN COMPARISON:  CT abdomen pelvis, 08/01/2021 FINDINGS: Examination is generally limited by breath motion artifact throughout Lower chest: Numerous pulmonary nodules throughout the included bilateral lung bases, poorly evaluated by MR. Hepatobiliary: No mass or other parenchymal abnormality identified. No gallstones. Moderate intra and extrahepatic biliary ductal dilatation, the common bile duct measuring up to 0.9 cm.  The common bile duct is abruptly truncated in the superior pancreatic head. Pancreas: Masslike, slightly hypoenhancing appearance of the pancreatic head, measuring approximately 3.5 x 2.2 cm (series 3, image 20), with abrupt truncation of the pancreatic duct in the superior pancreatic head, the pancreatic duct dilated distally up to 0.7 cm, with atrophy of the distal pancreatic parenchyma.No pancreatic ductal dilatation. Spleen:  Within normal limits in size and appearance. Adrenals/Urinary Tract: Normal adrenal glands. No renal masses or suspicious contrast enhancement identified. No evidence of hydronephrosis. Stomach/Bowel: Visualized portions within the abdomen are unremarkable. Vascular/Lymphatic: No discretely enlarged lymph nodes identified. There is abnormal contrast enhancing soft tissue about  the celiac axis (series 21, image 39). No abdominal aortic aneurysm demonstrated. Breath motion artifact significantly limits evaluation of the vessels in the vicinity of suspected pancreatic head mass; particularly the superior mesenteric vein, central splenic vein, and portal vein are not meaningfully assessed on this examination but were patent on prior CT dated 08/01/2021. Other:  None. Musculoskeletal: No suspicious osseous lesions identified. IMPRESSION: 1. Masslike, slightly hypoenhancing appearance of the pancreatic head, measuring approximately 3.5 x 2.2 cm, with abrupt truncation of the common bile duct and pancreatic duct in the superior pancreatic head, highly suspicious for pancreatic adenocarcinoma. 2. There are no discretely enlarged lymph nodes, however there is abnormal contrast enhancing soft tissue about the celiac axis, suspicious for nodal metastatic disease. 3. Numerous pulmonary nodules throughout the included bilateral lung bases, poorly evaluated by MR, better seen by prior CT, however these remain highly suspicious for pulmonary metastases. 4. Due to breath motion artifact, the vascular  structures, particularly the superior mesenteric, central splenic, and portal veins are poorly assessed, but were patent on prior CT. 5. Due to significant limitations of breath motion artifact on this examination, consider contrast enhanced CT for future follow-up and restaging imaging by CT. Electronically Signed   By: Delanna Ahmadi M.D.   On: 08/02/2021 09:35   DG ERCP WITH SPHINCTEROTOMY  Result Date: 08/03/2021 CLINICAL DATA:  Probable malignant obstructed jaundice secondary to pancreatic mass EXAM: ERCP TECHNIQUE: Multiple spot images obtained with the fluoroscopic device and submitted for interpretation post-procedure. FLUOROSCOPY TIME:  Fluoroscopy Time:  5 minutes 44 seconds reported Number of Acquired Spot Images: 0 COMPARISON:  MRCP 08/02/2021 FINDINGS: A total of 14 saved intraoperative images are submitted for review. The images depict a flexible duodenal scope in the descending duodenum with wire cannulation of the common bile duct. Mild biliary ductal dilatation with complete occlusion distally. On the final images, a self expanding biliary stent has been placed and is partially opened. IMPRESSION: 1. Complete obstruction of the distal common bile duct. 2. Successful placement of self expanding metallic biliary stent. These images were submitted for radiologic interpretation only. Please see the procedural report for the amount of contrast and the fluoroscopy time utilized. Electronically Signed   By: Jacqulynn Cadet M.D.   On: 08/03/2021 16:30   DG C-Arm 1-60 Min-No Report  Result Date: 08/03/2021 Fluoroscopy was utilized by the requesting physician.  No radiographic interpretation.   MR ABDOMEN MRCP W WO CONTAST  Result Date: 08/02/2021 CLINICAL DATA:  Nausea, vomiting, biliary ductal dilatation, pancreatic ductal dilatation, possible pancreatic head mass EXAM: MRI ABDOMEN WITHOUT AND WITH CONTRAST (INCLUDING MRCP) TECHNIQUE: Multiplanar multisequence MR imaging of the abdomen was  performed both before and after the administration of intravenous contrast. Heavily T2-weighted images of the biliary and pancreatic ducts were obtained, and three-dimensional MRCP images were rendered by post processing. CONTRAST:  39mL GADAVIST GADOBUTROL 1 MMOL/ML IV SOLN COMPARISON:  CT abdomen pelvis, 08/01/2021 FINDINGS: Examination is generally limited by breath motion artifact throughout Lower chest: Numerous pulmonary nodules throughout the included bilateral lung bases, poorly evaluated by MR. Hepatobiliary: No mass or other parenchymal abnormality identified. No gallstones. Moderate intra and extrahepatic biliary ductal dilatation, the common bile duct measuring up to 0.9 cm. The common bile duct is abruptly truncated in the superior pancreatic head. Pancreas: Masslike, slightly hypoenhancing appearance of the pancreatic head, measuring approximately 3.5 x 2.2 cm (series 3, image 20), with abrupt truncation of the pancreatic duct in the superior pancreatic head, the pancreatic duct dilated distally  up to 0.7 cm, with atrophy of the distal pancreatic parenchyma.No pancreatic ductal dilatation. Spleen:  Within normal limits in size and appearance. Adrenals/Urinary Tract: Normal adrenal glands. No renal masses or suspicious contrast enhancement identified. No evidence of hydronephrosis. Stomach/Bowel: Visualized portions within the abdomen are unremarkable. Vascular/Lymphatic: No discretely enlarged lymph nodes identified. There is abnormal contrast enhancing soft tissue about the celiac axis (series 21, image 39). No abdominal aortic aneurysm demonstrated. Breath motion artifact significantly limits evaluation of the vessels in the vicinity of suspected pancreatic head mass; particularly the superior mesenteric vein, central splenic vein, and portal vein are not meaningfully assessed on this examination but were patent on prior CT dated 08/01/2021. Other:  None. Musculoskeletal: No suspicious osseous lesions  identified. IMPRESSION: 1. Masslike, slightly hypoenhancing appearance of the pancreatic head, measuring approximately 3.5 x 2.2 cm, with abrupt truncation of the common bile duct and pancreatic duct in the superior pancreatic head, highly suspicious for pancreatic adenocarcinoma. 2. There are no discretely enlarged lymph nodes, however there is abnormal contrast enhancing soft tissue about the celiac axis, suspicious for nodal metastatic disease. 3. Numerous pulmonary nodules throughout the included bilateral lung bases, poorly evaluated by MR, better seen by prior CT, however these remain highly suspicious for pulmonary metastases. 4. Due to breath motion artifact, the vascular structures, particularly the superior mesenteric, central splenic, and portal veins are poorly assessed, but were patent on prior CT. 5. Due to significant limitations of breath motion artifact on this examination, consider contrast enhanced CT for future follow-up and restaging imaging by CT. Electronically Signed   By: Delanna Ahmadi M.D.   On: 08/02/2021 09:35   US Abdomen Limited RUQ (LIVER/GB)  Result Date: 08/01/2021 CLINICAL DATA:  Jaundice. Itching skin. Generalized abdominal pain for 6 days. EXAM: ULTRASOUND ABDOMEN LIMITED RIGHT UPPER QUADRANT COMPARISON:  05/07/2004. FINDINGS: Gallbladder: Distended, but with no stones or wall thickening or pericholecystic fluid. There is a small amount of gallbladder sludge and the patient is tender over the gallbladder to transducer pressure. Common bile duct: Diameter: 7 mm.  Distal duct not visualized. Liver: Normal in size and overall echogenicity. No mass. Mild central intrahepatic bile duct dilation. Portal vein is patent on color Doppler imaging with normal direction of blood flow towards the liver. Other: None. IMPRESSION: 1. Common bile duct measures 7 mm and there is mild central intrahepatic bile duct dilation. The distal common bile duct is not well visualized. Cannot exclude a  distal duct stone or other obstructing lesion. Given the history of jaundice, consider follow-up MRCP/ERCP. 2. Distended gallbladder, no stones and no convincing acute cholecystitis. Electronically Signed   By: Lajean Manes M.D.   On: 08/01/2021 16:01     CT CHEST W CONTRAST  Result Date: 08/02/2021 CLINICAL DATA:  Presumed new diagnosis pancreatic cancer staging, pulmonary nodules EXAM: CT CHEST WITH CONTRAST TECHNIQUE: Multidetector CT imaging of the chest was performed during intravenous contrast administration. RADIATION DOSE REDUCTION: This exam was performed according to the departmental dose-optimization program which includes automated exposure control, adjustment of the mA and/or kV according to patient size and/or use of iterative reconstruction technique. CONTRAST:  25mL OMNIPAQUE IOHEXOL 300 MG/ML  SOLN COMPARISON:  CT abdomen pelvis, 08/01/2021 FINDINGS: Cardiovascular: Aortic atherosclerosis. Normal heart size. Scattered coronary artery calcifications. No pericardial effusion. Mediastinum/Nodes: No enlarged mediastinal, hilar, or axillary lymph nodes. Thyroid gland, trachea, and esophagus demonstrate no significant findings. Lungs/Pleura: Innumerable bilateral pulmonary nodules of varying sizes, largest in the right lung base measuring 1.5 x  1.4 cm (series 5, image 111). Additional index nodule of the posterior right apex measures 1.3 x 1.2 cm (series 5, image 33). No pleural effusion or pneumothorax. Upper Abdomen: No acute abnormality. Biliary and pancreatic ductal dilatation partially imaged masslike lesion of the pancreatic head better assessed by prior dedicated examination of the abdomen. Musculoskeletal: No chest wall abnormality. No suspicious osseous lesions identified. IMPRESSION: 1. Innumerable bilateral pulmonary nodules of varying sizes, consistent with metastatic disease. 2. Biliary and pancreatic ductal dilatation as well as partially imaged masslike lesion of the pancreatic head,  better assessed by prior dedicated examinations of the abdomen. 3. Coronary artery disease. Aortic Atherosclerosis (ICD10-I70.0). Electronically Signed   By: Delanna Ahmadi M.D.   On: 08/02/2021 13:10   CT ABDOMEN PELVIS W CONTRAST  Result Date: 08/01/2021 CLINICAL DATA:  Nausea vomiting. EXAM: CT ABDOMEN AND PELVIS WITH CONTRAST TECHNIQUE: Multidetector CT imaging of the abdomen and pelvis was performed using the standard protocol following bolus administration of intravenous contrast. RADIATION DOSE REDUCTION: This exam was performed according to the departmental dose-optimization program which includes automated exposure control, adjustment of the mA and/or kV according to patient size and/or use of iterative reconstruction technique. CONTRAST:  138mL OMNIPAQUE IOHEXOL 300 MG/ML  SOLN COMPARISON:  None. FINDINGS: Lower chest: Solid pulmonary nodules versus areas of consolidation in the right lower lobe, the largest measuring 1.5 cm. Multiple smaller solid subpleural pulmonary nodules in the left lung base. Hepatobiliary: Diffuse intrahepatic biliary ductal dilation. Normal appearance of the gallbladder. Mild distention of the proximal common bile duct. The distal common bile duct is difficult to visualize. Pancreas: Diffuse cystic dilation of the main pancreatic duct involving the body and tail of the pancreas. Bulbous heterogeneous appearance of the head of the pancreas. Spleen: Normal in size without focal abnormality. Adrenals/Urinary Tract: Adrenal glands are unremarkable. Kidneys are normal, without renal calculi, focal lesion, or hydronephrosis. Bladder is unremarkable. Stomach/Bowel: Stomach is within normal limits. Appendix appears normal. No evidence of bowel wall thickening, distention, or inflammatory changes. Vascular/Lymphatic: Aortic atherosclerosis. No enlarged abdominal or pelvic lymph nodes. 9 mm short axis lymph node in the porta hepatic is noted. Reproductive: Uterus and bilateral adnexa are  unremarkable. Other: No abdominal wall hernia or abnormality. No abdominopelvic ascites. Musculoskeletal: No acute or significant osseous findings. IMPRESSION: 1. Diffuse intrahepatic biliary ductal dilation with mild distention of the proximal common bile duct. The distal common bile duct is difficult to visualize. Bulbous heterogeneous appearance of the head of the pancreas. Cystic dilation of the main pancreatic duct in the body and head of the pancreas. Further evaluation with pancreatic mass protocol MRI of the abdomen may be considered. 2. Solid pulmonary nodules versus areas of consolidation in the right lower lobe, the largest measuring 1.5 cm. Multiple smaller solid subpleural pulmonary nodules in the left lung base. Metastatic disease cannot be excluded. Further evaluation with complete chest CT, or PET-CT if the imaging of the abdomen is positive for malignancy, may be considered. 3. 9 mm short axis lymph node in the porta hepatic region, indeterminate. 4. Aortic atherosclerosis. Aortic Atherosclerosis (ICD10-I70.0). Electronically Signed   By: Fidela Salisbury M.D.   On: 08/01/2021 16:35   MR 3D Recon At Scanner  Result Date: 08/02/2021 CLINICAL DATA:  Nausea, vomiting, biliary ductal dilatation, pancreatic ductal dilatation, possible pancreatic head mass EXAM: MRI ABDOMEN WITHOUT AND WITH CONTRAST (INCLUDING MRCP) TECHNIQUE: Multiplanar multisequence MR imaging of the abdomen was performed both before and after the administration of intravenous contrast. Heavily T2-weighted images  of the biliary and pancreatic ducts were obtained, and three-dimensional MRCP images were rendered by post processing. CONTRAST:  87mL GADAVIST GADOBUTROL 1 MMOL/ML IV SOLN COMPARISON:  CT abdomen pelvis, 08/01/2021 FINDINGS: Examination is generally limited by breath motion artifact throughout Lower chest: Numerous pulmonary nodules throughout the included bilateral lung bases, poorly evaluated by MR. Hepatobiliary: No  mass or other parenchymal abnormality identified. No gallstones. Moderate intra and extrahepatic biliary ductal dilatation, the common bile duct measuring up to 0.9 cm. The common bile duct is abruptly truncated in the superior pancreatic head. Pancreas: Masslike, slightly hypoenhancing appearance of the pancreatic head, measuring approximately 3.5 x 2.2 cm (series 3, image 20), with abrupt truncation of the pancreatic duct in the superior pancreatic head, the pancreatic duct dilated distally up to 0.7 cm, with atrophy of the distal pancreatic parenchyma.No pancreatic ductal dilatation. Spleen:  Within normal limits in size and appearance. Adrenals/Urinary Tract: Normal adrenal glands. No renal masses or suspicious contrast enhancement identified. No evidence of hydronephrosis. Stomach/Bowel: Visualized portions within the abdomen are unremarkable. Vascular/Lymphatic: No discretely enlarged lymph nodes identified. There is abnormal contrast enhancing soft tissue about the celiac axis (series 21, image 39). No abdominal aortic aneurysm demonstrated. Breath motion artifact significantly limits evaluation of the vessels in the vicinity of suspected pancreatic head mass; particularly the superior mesenteric vein, central splenic vein, and portal vein are not meaningfully assessed on this examination but were patent on prior CT dated 08/01/2021. Other:  None. Musculoskeletal: No suspicious osseous lesions identified. IMPRESSION: 1. Masslike, slightly hypoenhancing appearance of the pancreatic head, measuring approximately 3.5 x 2.2 cm, with abrupt truncation of the common bile duct and pancreatic duct in the superior pancreatic head, highly suspicious for pancreatic adenocarcinoma. 2. There are no discretely enlarged lymph nodes, however there is abnormal contrast enhancing soft tissue about the celiac axis, suspicious for nodal metastatic disease. 3. Numerous pulmonary nodules throughout the included bilateral lung  bases, poorly evaluated by MR, better seen by prior CT, however these remain highly suspicious for pulmonary metastases. 4. Due to breath motion artifact, the vascular structures, particularly the superior mesenteric, central splenic, and portal veins are poorly assessed, but were patent on prior CT. 5. Due to significant limitations of breath motion artifact on this examination, consider contrast enhanced CT for future follow-up and restaging imaging by CT. Electronically Signed   By: Delanna Ahmadi M.D.   On: 08/02/2021 09:35   DG ERCP WITH SPHINCTEROTOMY  Result Date: 08/03/2021 CLINICAL DATA:  Probable malignant obstructed jaundice secondary to pancreatic mass EXAM: ERCP TECHNIQUE: Multiple spot images obtained with the fluoroscopic device and submitted for interpretation post-procedure. FLUOROSCOPY TIME:  Fluoroscopy Time:  5 minutes 44 seconds reported Number of Acquired Spot Images: 0 COMPARISON:  MRCP 08/02/2021 FINDINGS: A total of 14 saved intraoperative images are submitted for review. The images depict a flexible duodenal scope in the descending duodenum with wire cannulation of the common bile duct. Mild biliary ductal dilatation with complete occlusion distally. On the final images, a self expanding biliary stent has been placed and is partially opened. IMPRESSION: 1. Complete obstruction of the distal common bile duct. 2. Successful placement of self expanding metallic biliary stent. These images were submitted for radiologic interpretation only. Please see the procedural report for the amount of contrast and the fluoroscopy time utilized. Electronically Signed   By: Jacqulynn Cadet M.D.   On: 08/03/2021 16:30   DG C-Arm 1-60 Min-No Report  Result Date: 08/03/2021 Fluoroscopy was utilized by the requesting  physician.  No radiographic interpretation.   MR ABDOMEN MRCP W WO CONTAST  Result Date: 08/02/2021 CLINICAL DATA:  Nausea, vomiting, biliary ductal dilatation, pancreatic ductal  dilatation, possible pancreatic head mass EXAM: MRI ABDOMEN WITHOUT AND WITH CONTRAST (INCLUDING MRCP) TECHNIQUE: Multiplanar multisequence MR imaging of the abdomen was performed both before and after the administration of intravenous contrast. Heavily T2-weighted images of the biliary and pancreatic ducts were obtained, and three-dimensional MRCP images were rendered by post processing. CONTRAST:  1mL GADAVIST GADOBUTROL 1 MMOL/ML IV SOLN COMPARISON:  CT abdomen pelvis, 08/01/2021 FINDINGS: Examination is generally limited by breath motion artifact throughout Lower chest: Numerous pulmonary nodules throughout the included bilateral lung bases, poorly evaluated by MR. Hepatobiliary: No mass or other parenchymal abnormality identified. No gallstones. Moderate intra and extrahepatic biliary ductal dilatation, the common bile duct measuring up to 0.9 cm. The common bile duct is abruptly truncated in the superior pancreatic head. Pancreas: Masslike, slightly hypoenhancing appearance of the pancreatic head, measuring approximately 3.5 x 2.2 cm (series 3, image 20), with abrupt truncation of the pancreatic duct in the superior pancreatic head, the pancreatic duct dilated distally up to 0.7 cm, with atrophy of the distal pancreatic parenchyma.No pancreatic ductal dilatation. Spleen:  Within normal limits in size and appearance. Adrenals/Urinary Tract: Normal adrenal glands. No renal masses or suspicious contrast enhancement identified. No evidence of hydronephrosis. Stomach/Bowel: Visualized portions within the abdomen are unremarkable. Vascular/Lymphatic: No discretely enlarged lymph nodes identified. There is abnormal contrast enhancing soft tissue about the celiac axis (series 21, image 39). No abdominal aortic aneurysm demonstrated. Breath motion artifact significantly limits evaluation of the vessels in the vicinity of suspected pancreatic head mass; particularly the superior mesenteric vein, central splenic vein,  and portal vein are not meaningfully assessed on this examination but were patent on prior CT dated 08/01/2021. Other:  None. Musculoskeletal: No suspicious osseous lesions identified. IMPRESSION: 1. Masslike, slightly hypoenhancing appearance of the pancreatic head, measuring approximately 3.5 x 2.2 cm, with abrupt truncation of the common bile duct and pancreatic duct in the superior pancreatic head, highly suspicious for pancreatic adenocarcinoma. 2. There are no discretely enlarged lymph nodes, however there is abnormal contrast enhancing soft tissue about the celiac axis, suspicious for nodal metastatic disease. 3. Numerous pulmonary nodules throughout the included bilateral lung bases, poorly evaluated by MR, better seen by prior CT, however these remain highly suspicious for pulmonary metastases. 4. Due to breath motion artifact, the vascular structures, particularly the superior mesenteric, central splenic, and portal veins are poorly assessed, but were patent on prior CT. 5. Due to significant limitations of breath motion artifact on this examination, consider contrast enhanced CT for future follow-up and restaging imaging by CT. Electronically Signed   By: Delanna Ahmadi M.D.   On: 08/02/2021 09:35   US Abdomen Limited RUQ (LIVER/GB)  Result Date: 08/01/2021 CLINICAL DATA:  Jaundice. Itching skin. Generalized abdominal pain for 6 days. EXAM: ULTRASOUND ABDOMEN LIMITED RIGHT UPPER QUADRANT COMPARISON:  05/07/2004. FINDINGS: Gallbladder: Distended, but with no stones or wall thickening or pericholecystic fluid. There is a small amount of gallbladder sludge and the patient is tender over the gallbladder to transducer pressure. Common bile duct: Diameter: 7 mm.  Distal duct not visualized. Liver: Normal in size and overall echogenicity. No mass. Mild central intrahepatic bile duct dilation. Portal vein is patent on color Doppler imaging with normal direction of blood flow towards the liver. Other: None.  IMPRESSION: 1. Common bile duct measures 7 mm and there is mild  central intrahepatic bile duct dilation. The distal common bile duct is not well visualized. Cannot exclude a distal duct stone or other obstructing lesion. Given the history of jaundice, consider follow-up MRCP/ERCP. 2. Distended gallbladder, no stones and no convincing acute cholecystitis. Electronically Signed   By: Lajean Manes M.D.   On: 08/01/2021 16:01    Pathology:   FINAL MICROSCOPIC DIAGNOSIS:  - Suspicious for malignancy   SPECIMEN ADEQUACY:  Satisfactory for evaluation   IMMEDIATE EVALUATION:  SUSPICIOUS FOR MALIGNANCY (NDK)     Assessment and Plan:   This is a very pleasant 81 year old female patient with chief complaint of abdominal pain, jaundice, pale stools who was found to have pancreatic head mass measuring approximately 3.5 x 2.0 cm with abrupt truncation in the CBD and pancreatic duct highly suspicious for pancreatic adenocarcinoma, numerous pulmonary nodules in both lungs concerning for pulmonary metastasis, medical oncology was consulted to discuss above-mentioned information and treatment recommendations.  I did explain to the patient that we do not have a definitive evidence of adenocarcinoma on pathology although the situation appears to be very highly suspicious for malignancy arising from the pancreas.  I did explain to her and the family that if this were indeed pancreatic adenocarcinoma metastatic to lung, this is considered an advanced pancreatic cancer and it is not operable.  Patient anyway says she would have never excepted surgery since this is too much to go through.  We have discussed about possible options including systemic chemotherapy and the median overall survival in metastatic pancreatic cancer with treatment.  The moment I brought up the word chemotherapy, patient says she is never going to take it.  She does not like to take any medication let alone chemotherapy.  She understands the  course of the disease if she were not to take any chemotherapy or for that matter any treatment.  We have discussed that survival could be in the order of weeks to months if she does not undergo any treatment, her impending problem is liver failure.  She understands that her life expectancy is limited and she is familiar with hospice and she would like to enroll in home hospice.  She is very particular that she would like to stay home while she is dying and she would like to stay closer to family.  I have discussed that hospice team will help her and the goal is to make her comfortable and not to treat the cancer.  She would like to go home as soon as possible at this time. I also discussed about genetic testing especially with son since there is first-degree family member with metastatic pancreatic cancer. I am not quite sure if we will be able to obtain genetic testing while she is still hospitalized but I will send an in basket message to our genetics team if they could facilitate this. I have gone over the imaging with the family, discussed about imaging findings and prognosis in detail. The length of time of the face-to-face encounter was 60 minutes. More than 50% of time was spent counseling and coordination of care.     Thank you for this referral.

## 2021-08-04 NOTE — Progress Notes (Signed)
°  Transition of Care [facility]) Screening Note   Patient Details  Name: GLORA HULGAN Date of Birth: 1941/06/10   Transition of Care [facility]) CM/SW Contact:    Perina Salvaggio, Marjie Skiff, RN Phone Number: 08/04/2021, 2:51 PM    Transition of Care Department [facility]) has reviewed patient and no TOC needs have been identified at this time. We will continue to monitor patient advancement through interdisciplinary progression rounds. If new patient transition needs arise, please place a TOC consult.

## 2021-08-04 NOTE — Progress Notes (Signed)
Pt and family decided to go home and set up hospice from there, they did not want to stay overnight to have it set up in the morning by SW. Alinda Sierras and Girguis made aware. DC packet and instructions given to pt with 3 family members at bedside. All verbalized understanding. IV removed. All belongings returned. Pt left via wheelchair with staff.

## 2021-08-04 NOTE — Progress Notes (Signed)
Subjective: No complaints overnight.  Feeling well.  Pain improved and it is also controlled with pain medication.  Objective: Vital signs in last 24 hours: Temp:  [97.4 F (36.3 C)-98.2 F (36.8 C)] 97.4 F (36.3 C) (01/18 0526) Pulse Rate:  [74-80] 74 (01/18 0526) Resp:  [15-22] 15 (01/18 0526) BP: (90-162)/(56-80) 117/63 (01/18 0526) SpO2:  [95 %-98 %] 97 % (01/18 0526) Weight:  [67.1 kg] 67.1 kg (01/17 1936) Last BM Date: 08/02/21  Intake/Output from previous day: 01/17 0701 - 01/18 0700 In: 2387.7 [I.V.:2387.7] Out: -  Intake/Output this shift: No intake/output data recorded.  General appearance: alert and no distress GI: soft, non-tender; bowel sounds normal; no masses,  no organomegaly  Lab Results: Recent Labs    08/02/21 0510 08/03/21 0512 08/04/21 0508  WBC 7.2 6.0 9.8  HGB 12.4 12.3 11.6*  HCT 38.0 37.3 34.7*  PLT 237 226 201   BMET Recent Labs    08/02/21 0510 08/03/21 0512 08/04/21 0508  NA 134* 136 133*  K 4.3 4.6 4.0  CL 100 102 99  CO2 26 29 27   GLUCOSE 112* 113* 112*  BUN 14 9 10   CREATININE 0.62 0.56 0.75  CALCIUM 8.8* 9.0 8.7*   LFT Recent Labs    08/01/21 1433 08/02/21 0510 08/04/21 0508  PROT 7.1   < > 5.9*  ALBUMIN 4.2   < > 3.1*  AST 571*   < > 386*  ALT 1,361*   < > 960*  ALKPHOS 380*   < > 264*  BILITOT 12.6*   < > 5.8*  BILIDIR 7.4*  --   --   IBILI 5.2*  --   --    < > = values in this interval not displayed.   PT/INR Recent Labs    08/01/21 1444  LABPROT 12.7  INR 1.0   Hepatitis Panel No results for input(s): HEPBSAG, HCVAB, HEPAIGM, HEPBIGM in the last 72 hours. C-Diff No results for input(s): CDIFFTOX in the last 72 hours. Fecal Lactopherrin No results for input(s): FECLLACTOFRN in the last 72 hours.  Studies/Results: CT CHEST W CONTRAST  Result Date: 08/02/2021 CLINICAL DATA:  Presumed new diagnosis pancreatic cancer staging, pulmonary nodules EXAM: CT CHEST WITH CONTRAST TECHNIQUE: Multidetector CT  imaging of the chest was performed during intravenous contrast administration. RADIATION DOSE REDUCTION: This exam was performed according to the departmental dose-optimization program which includes automated exposure control, adjustment of the mA and/or kV according to patient size and/or use of iterative reconstruction technique. CONTRAST:  64mL OMNIPAQUE IOHEXOL 300 MG/ML  SOLN COMPARISON:  CT abdomen pelvis, 08/01/2021 FINDINGS: Cardiovascular: Aortic atherosclerosis. Normal heart size. Scattered coronary artery calcifications. No pericardial effusion. Mediastinum/Nodes: No enlarged mediastinal, hilar, or axillary lymph nodes. Thyroid gland, trachea, and esophagus demonstrate no significant findings. Lungs/Pleura: Innumerable bilateral pulmonary nodules of varying sizes, largest in the right lung base measuring 1.5 x 1.4 cm (series 5, image 111). Additional index nodule of the posterior right apex measures 1.3 x 1.2 cm (series 5, image 33). No pleural effusion or pneumothorax. Upper Abdomen: No acute abnormality. Biliary and pancreatic ductal dilatation partially imaged masslike lesion of the pancreatic head better assessed by prior dedicated examination of the abdomen. Musculoskeletal: No chest wall abnormality. No suspicious osseous lesions identified. IMPRESSION: 1. Innumerable bilateral pulmonary nodules of varying sizes, consistent with metastatic disease. 2. Biliary and pancreatic ductal dilatation as well as partially imaged masslike lesion of the pancreatic head, better assessed by prior dedicated examinations of the abdomen. 3. Coronary  artery disease. Aortic Atherosclerosis (ICD10-I70.0). Electronically Signed   By: Delanna Ahmadi M.D.   On: 08/02/2021 13:10   MR 3D Recon At Scanner  Result Date: 08/02/2021 CLINICAL DATA:  Nausea, vomiting, biliary ductal dilatation, pancreatic ductal dilatation, possible pancreatic head mass EXAM: MRI ABDOMEN WITHOUT AND WITH CONTRAST (INCLUDING MRCP) TECHNIQUE:  Multiplanar multisequence MR imaging of the abdomen was performed both before and after the administration of intravenous contrast. Heavily T2-weighted images of the biliary and pancreatic ducts were obtained, and three-dimensional MRCP images were rendered by post processing. CONTRAST:  72mL GADAVIST GADOBUTROL 1 MMOL/ML IV SOLN COMPARISON:  CT abdomen pelvis, 08/01/2021 FINDINGS: Examination is generally limited by breath motion artifact throughout Lower chest: Numerous pulmonary nodules throughout the included bilateral lung bases, poorly evaluated by MR. Hepatobiliary: No mass or other parenchymal abnormality identified. No gallstones. Moderate intra and extrahepatic biliary ductal dilatation, the common bile duct measuring up to 0.9 cm. The common bile duct is abruptly truncated in the superior pancreatic head. Pancreas: Masslike, slightly hypoenhancing appearance of the pancreatic head, measuring approximately 3.5 x 2.2 cm (series 3, image 20), with abrupt truncation of the pancreatic duct in the superior pancreatic head, the pancreatic duct dilated distally up to 0.7 cm, with atrophy of the distal pancreatic parenchyma.No pancreatic ductal dilatation. Spleen:  Within normal limits in size and appearance. Adrenals/Urinary Tract: Normal adrenal glands. No renal masses or suspicious contrast enhancement identified. No evidence of hydronephrosis. Stomach/Bowel: Visualized portions within the abdomen are unremarkable. Vascular/Lymphatic: No discretely enlarged lymph nodes identified. There is abnormal contrast enhancing soft tissue about the celiac axis (series 21, image 39). No abdominal aortic aneurysm demonstrated. Breath motion artifact significantly limits evaluation of the vessels in the vicinity of suspected pancreatic head mass; particularly the superior mesenteric vein, central splenic vein, and portal vein are not meaningfully assessed on this examination but were patent on prior CT dated 08/01/2021.  Other:  None. Musculoskeletal: No suspicious osseous lesions identified. IMPRESSION: 1. Masslike, slightly hypoenhancing appearance of the pancreatic head, measuring approximately 3.5 x 2.2 cm, with abrupt truncation of the common bile duct and pancreatic duct in the superior pancreatic head, highly suspicious for pancreatic adenocarcinoma. 2. There are no discretely enlarged lymph nodes, however there is abnormal contrast enhancing soft tissue about the celiac axis, suspicious for nodal metastatic disease. 3. Numerous pulmonary nodules throughout the included bilateral lung bases, poorly evaluated by MR, better seen by prior CT, however these remain highly suspicious for pulmonary metastases. 4. Due to breath motion artifact, the vascular structures, particularly the superior mesenteric, central splenic, and portal veins are poorly assessed, but were patent on prior CT. 5. Due to significant limitations of breath motion artifact on this examination, consider contrast enhanced CT for future follow-up and restaging imaging by CT. Electronically Signed   By: Delanna Ahmadi M.D.   On: 08/02/2021 09:35   DG ERCP WITH SPHINCTEROTOMY  Result Date: 08/03/2021 CLINICAL DATA:  Probable malignant obstructed jaundice secondary to pancreatic mass EXAM: ERCP TECHNIQUE: Multiple spot images obtained with the fluoroscopic device and submitted for interpretation post-procedure. FLUOROSCOPY TIME:  Fluoroscopy Time:  5 minutes 44 seconds reported Number of Acquired Spot Images: 0 COMPARISON:  MRCP 08/02/2021 FINDINGS: A total of 14 saved intraoperative images are submitted for review. The images depict a flexible duodenal scope in the descending duodenum with wire cannulation of the common bile duct. Mild biliary ductal dilatation with complete occlusion distally. On the final images, a self expanding biliary stent has been placed and  is partially opened. IMPRESSION: 1. Complete obstruction of the distal common bile duct. 2.  Successful placement of self expanding metallic biliary stent. These images were submitted for radiologic interpretation only. Please see the procedural report for the amount of contrast and the fluoroscopy time utilized. Electronically Signed   By: Jacqulynn Cadet M.D.   On: 08/03/2021 16:30   DG C-Arm 1-60 Min-No Report  Result Date: 08/03/2021 Fluoroscopy was utilized by the requesting physician.  No radiographic interpretation.   MR ABDOMEN MRCP W WO CONTAST  Result Date: 08/02/2021 CLINICAL DATA:  Nausea, vomiting, biliary ductal dilatation, pancreatic ductal dilatation, possible pancreatic head mass EXAM: MRI ABDOMEN WITHOUT AND WITH CONTRAST (INCLUDING MRCP) TECHNIQUE: Multiplanar multisequence MR imaging of the abdomen was performed both before and after the administration of intravenous contrast. Heavily T2-weighted images of the biliary and pancreatic ducts were obtained, and three-dimensional MRCP images were rendered by post processing. CONTRAST:  30mL GADAVIST GADOBUTROL 1 MMOL/ML IV SOLN COMPARISON:  CT abdomen pelvis, 08/01/2021 FINDINGS: Examination is generally limited by breath motion artifact throughout Lower chest: Numerous pulmonary nodules throughout the included bilateral lung bases, poorly evaluated by MR. Hepatobiliary: No mass or other parenchymal abnormality identified. No gallstones. Moderate intra and extrahepatic biliary ductal dilatation, the common bile duct measuring up to 0.9 cm. The common bile duct is abruptly truncated in the superior pancreatic head. Pancreas: Masslike, slightly hypoenhancing appearance of the pancreatic head, measuring approximately 3.5 x 2.2 cm (series 3, image 20), with abrupt truncation of the pancreatic duct in the superior pancreatic head, the pancreatic duct dilated distally up to 0.7 cm, with atrophy of the distal pancreatic parenchyma.No pancreatic ductal dilatation. Spleen:  Within normal limits in size and appearance. Adrenals/Urinary Tract:  Normal adrenal glands. No renal masses or suspicious contrast enhancement identified. No evidence of hydronephrosis. Stomach/Bowel: Visualized portions within the abdomen are unremarkable. Vascular/Lymphatic: No discretely enlarged lymph nodes identified. There is abnormal contrast enhancing soft tissue about the celiac axis (series 21, image 39). No abdominal aortic aneurysm demonstrated. Breath motion artifact significantly limits evaluation of the vessels in the vicinity of suspected pancreatic head mass; particularly the superior mesenteric vein, central splenic vein, and portal vein are not meaningfully assessed on this examination but were patent on prior CT dated 08/01/2021. Other:  None. Musculoskeletal: No suspicious osseous lesions identified. IMPRESSION: 1. Masslike, slightly hypoenhancing appearance of the pancreatic head, measuring approximately 3.5 x 2.2 cm, with abrupt truncation of the common bile duct and pancreatic duct in the superior pancreatic head, highly suspicious for pancreatic adenocarcinoma. 2. There are no discretely enlarged lymph nodes, however there is abnormal contrast enhancing soft tissue about the celiac axis, suspicious for nodal metastatic disease. 3. Numerous pulmonary nodules throughout the included bilateral lung bases, poorly evaluated by MR, better seen by prior CT, however these remain highly suspicious for pulmonary metastases. 4. Due to breath motion artifact, the vascular structures, particularly the superior mesenteric, central splenic, and portal veins are poorly assessed, but were patent on prior CT. 5. Due to significant limitations of breath motion artifact on this examination, consider contrast enhanced CT for future follow-up and restaging imaging by CT. Electronically Signed   By: Delanna Ahmadi M.D.   On: 08/02/2021 09:35    Medications: Scheduled: Continuous:  lactated ringers 100 mL/hr at 08/04/21 6384    Assessment/Plan: 1) Pancreatic head mass,  presumed to be an adenocarcinoma. 2) Obstructive jaundice.   No complications from the procedures yesterday.  She appears to be at her baseline  level of abdominal pain.  Her liver enzymes improved s/p stent placement.  Plan: 1) Okay to D/C home. 2) ? Oncology consult here in the hospital versus as an outpatient. 3) D/C home pain control. 4) FNA results will be communicated to the patient and her family.  LOS: 2 days   Annette Key D 08/04/2021, 7:05 AM

## 2021-08-04 NOTE — Anesthesia Postprocedure Evaluation (Signed)
Anesthesia Post Note  Patient: Annette Key  Procedure(s) Performed: ENDOSCOPIC RETROGRADE CHOLANGIOPANCREATOGRAPHY (ERCP) UPPER ENDOSCOPIC ULTRASOUND (EUS) LINEAR ESOPHAGOGASTRODUODENOSCOPY (EGD) SPHINCTEROTOMY BILIARY STENT PLACEMENT FINE NEEDLE ASPIRATION (FNA) LINEAR     Patient location during evaluation: PACU Anesthesia Type: General Level of consciousness: awake and alert Pain management: pain level controlled Vital Signs Assessment: post-procedure vital signs reviewed and stable Respiratory status: spontaneous breathing, nonlabored ventilation, respiratory function stable and patient connected to nasal cannula oxygen Cardiovascular status: blood pressure returned to baseline and stable Postop Assessment: no apparent nausea or vomiting Anesthetic complications: no   No notable events documented.  Last Vitals:  Vitals:   08/03/21 2117 08/04/21 0526  BP: (!) 114/56 117/63  Pulse: 75 74  Resp: 16 15  Temp: 36.8 C (!) 36.3 C  SpO2: 97% 97%    Last Pain:  Vitals:   08/04/21 0800  TempSrc:   PainSc: 4                  [name]

## 2021-08-04 NOTE — Discharge Summary (Signed)
Physician Discharge Summary   Annette Key IWP:[id number] DOB: [birth year] DOA: 08/01/2021  PCP: Annette Late, MD  Admit date: 08/01/2021 Discharge date: 08/04/2021   Admitted From: Home Disposition: Home Discharging physician: Dwyane Dee, MD  Recommendations for Outpatient Follow-up:  Establish care with home hospice  Discharge Condition: stable CODE STATUS: Full Diet recommendation:  Diet Orders (From admission, onward)     Start     Ordered   08/04/21 0000  Diet general        08/04/21 1706   08/03/21 1720  Diet regular Room service appropriate? Yes; Fluid consistency: Thin  Diet effective now       Question Answer Comment  Room service appropriate? Yes   Fluid consistency: Thin      08/03/21 1719            Hospital Course:  Ms. Annette Key is an 81 year old female who presented with abdominal pain that was progressively worsening at home with associated decreased appetite and lethargy.  She also had developed jaundice. She underwent extensive work-up on admission including right upper quadrant ultrasound, CT abdomen/pelvis, and MRI abdomen. Findings were consistent with a lesion involving pancreatic head measuring 3.5 x 2.2 cm concerning for a primary pancreatic cancer.  There were also numerous pulmonary nodules noted concerning for pulmonary metastases. LFT pattern was consistent with an obstructive pattern likely due to the pancreatic lesion and she underwent CBD stent placement with GI on 08/03/2021. She also met with oncology afterwards and she declined treatment with chemotherapy and was not considered to be a surgical candidate due to staging of disease.  She elected for discharging home with home hospice.  Ambulatory referral was placed prior to discharge and patient was discharged home in stable condition.   Principal Diagnosis: Biliary obstruction  Discharge Diagnoses: Principal Problem:   Biliary obstruction Active Problems:   Jaundice   RUQ pain    Transaminitis   Pancreatic mass   Hyponatremia   Discharge Instructions     Ambulatory referral to Hospice   Complete by: As directed    Diet general   Complete by: As directed    Increase activity slowly   Complete by: As directed       Allergies as of 08/04/2021       Reactions   Sulfa Antibiotics Palpitations        Medication List     STOP taking these medications    famotidine 20 MG tablet Commonly known as: PEPCID       TAKE these medications    LORazepam 1 MG tablet Commonly known as: Ativan Take 1 tablet (1 mg total) by mouth 2 (two) times daily as needed for up to 15 days for anxiety.   oxyCODONE 5 MG immediate release tablet Commonly known as: Oxy IR/ROXICODONE Take 1 tablet (5 mg total) by mouth every 4 (four) hours as needed for up to 7 days for moderate pain.        Allergies  Allergen Reactions   Sulfa Antibiotics Palpitations    Consultations: GI Oncology  Discharge Exam: BP 140/75 (BP Location: Left Arm)    Pulse 76    Temp 98.9 F (37.2 C) (Oral)    Resp 16    Ht $R'5\' 4"'uZ$  (1.626 m)    Wt 67.1 kg    SpO2 97%    BMI 25.40 kg/m  Physical Exam Constitutional:      General: She is not in acute distress. HENT:     Head: Normocephalic and  atraumatic.     Mouth/Throat:     Mouth: Mucous membranes are moist.  Eyes:     Extraocular Movements: Extraocular movements intact.  Cardiovascular:     Rate and Rhythm: Normal rate and regular rhythm.  Pulmonary:     Effort: Pulmonary effort is normal.     Breath sounds: Normal breath sounds.  Abdominal:     General: Bowel sounds are normal.     Palpations: Abdomen is soft.     Tenderness: There is abdominal tenderness.  Musculoskeletal:        General: Normal range of motion.     Cervical back: Normal range of motion and neck supple.  Skin:    General: Skin is warm and dry.  Neurological:     General: No focal deficit present.     Mental Status: She is alert.  Psychiatric:        Mood  and Affect: Mood normal.     The results of significant diagnostics from this hospitalization (including imaging, microbiology, ancillary and laboratory) are listed below for reference.   Microbiology: Recent Results (from the past 240 hour(s))  Resp Panel by RT-PCR (Flu A&B, Covid) Nasopharyngeal Swab     Status: None   Collection Time: 08/01/21  3:24 PM   Specimen: Nasopharyngeal Swab; Nasopharyngeal(NP) swabs in vial transport medium  Result Value Ref Range Status   SARS Coronavirus 2 by RT PCR NEGATIVE NEGATIVE Final    Comment: (NOTE) SARS-CoV-2 target nucleic acids are NOT DETECTED.  The SARS-CoV-2 RNA is generally detectable in upper respiratory specimens during the acute phase of infection. The lowest concentration of SARS-CoV-2 viral copies this assay can detect is 138 copies/mL. A negative result does not preclude SARS-Cov-2 infection and should not be used as the sole basis for treatment or other patient management decisions. A negative result may occur with  improper specimen collection/handling, submission of specimen other than nasopharyngeal swab, presence of viral mutation(s) within the areas targeted by this assay, and inadequate number of viral copies(<138 copies/mL). A negative result must be combined with clinical observations, patient history, and epidemiological information. The expected result is Negative.  Fact Sheet for Patients:  EntrepreneurPulse.com.au  Fact Sheet for Healthcare Providers:  IncredibleEmployment.be  This test is no t yet approved or cleared by the [place] FDA and  has been authorized for detection and/or diagnosis of SARS-CoV-2 by FDA under an Emergency Use Authorization (EUA). This EUA will remain  in effect (meaning this test can be used) for the duration of the COVID-19 declaration under Section 564(b)(1) of the Act, 21 U.S.C.section 360bbb-3(b)(1), unless the authorization is terminated  or  revoked sooner.       Influenza A by PCR NEGATIVE NEGATIVE Final   Influenza B by PCR NEGATIVE NEGATIVE Final    Comment: (NOTE) The Xpert Xpress SARS-CoV-2/FLU/RSV plus assay is intended as an aid in the diagnosis of influenza from Nasopharyngeal swab specimens and should not be used as a sole basis for treatment. Nasal washings and aspirates are unacceptable for Xpert Xpress SARS-CoV-2/FLU/RSV testing.  Fact Sheet for Patients: EntrepreneurPulse.com.au  Fact Sheet for Healthcare Providers: IncredibleEmployment.be  This test is not yet approved or cleared by the [place] FDA and has been authorized for detection and/or diagnosis of SARS-CoV-2 by FDA under an Emergency Use Authorization (EUA). This EUA will remain in effect (meaning this test can be used) for the duration of the COVID-19 declaration under Section 564(b)(1) of the Act, 21 U.S.C. section 360bbb-3(b)(1), unless the  authorization is terminated or revoked.  Performed at [facility], [address], [place], [place] [address]      Labs: BNP (last 3 results) No results for input(s): BNP in the last 8760 hours. Basic Metabolic Panel: Recent Labs  Lab 08/01/21 1433 08/02/21 0510 08/03/21 0512 08/04/21 0508  NA 132* 134* 136 133*  K 4.0 4.3 4.6 4.0  CL 95* 100 102 99  CO2 $Re'25 26 29 27  'wfO$ GLUCOSE 135* 112* 113* 112*  BUN $Re'16 14 9 10  'xdV$ CREATININE 0.86 0.62 0.56 0.75  CALCIUM 9.6 8.8* 9.0 8.7*   Liver Function Tests: Recent Labs  Lab 08/01/21 1433 08/02/21 0510 08/03/21 0512 08/04/21 0508  AST 571* 521* 501* 386*  ALT 1,361* 1,130* 1,097* 960*  ALKPHOS 380* 278* 297* 264*  BILITOT 12.6* 10.2* 10.4* 5.8*  PROT 7.1 6.3* 6.2* 5.9*  ALBUMIN 4.2 3.1* 3.1* 3.1*   Recent Labs  Lab 08/01/21 1444  LIPASE 26   No results for input(s): AMMONIA in the last 168 hours. CBC: Recent Labs  Lab 08/01/21 1444 08/02/21 0510 08/03/21 0512 08/04/21 0508   WBC 9.2 7.2 6.0 9.8  HGB 14.2 12.4 12.3 11.6*  HCT 42.7 38.0 37.3 34.7*  MCV 89.0 90.9 91.0 89.0  PLT 289 237 226 201   Cardiac Enzymes: No results for input(s): CKTOTAL, CKMB, CKMBINDEX, TROPONINI in the last 168 hours. BNP: Invalid input(s): POCBNP CBG: No results for input(s): GLUCAP in the last 168 hours. D-Dimer No results for input(s): DDIMER in the last 72 hours. Hgb A1c No results for input(s): HGBA1C in the last 72 hours. Lipid Profile No results for input(s): CHOL, HDL, LDLCALC, TRIG, CHOLHDL, LDLDIRECT in the last 72 hours. Thyroid function studies No results for input(s): TSH, T4TOTAL, T3FREE, THYROIDAB in the last 72 hours.  Invalid input(s): FREET3 Anemia work up No results for input(s): VITAMINB12, FOLATE, FERRITIN, TIBC, IRON, RETICCTPCT in the last 72 hours. Urinalysis    Component Value Date/Time   COLORURINE YELLOW 08/01/2021 1428   APPEARANCEUR CLEAR 08/01/2021 1428   LABSPEC >1.046 (H) 08/01/2021 1428   PHURINE 6.0 08/01/2021 1428   GLUCOSEU NEGATIVE 08/01/2021 1428   HGBUR NEGATIVE 08/01/2021 1428   BILIRUBINUR MODERATE (A) 08/01/2021 1428   KETONESUR TRACE (A) 08/01/2021 1428   PROTEINUR TRACE (A) 08/01/2021 1428   NITRITE NEGATIVE 08/01/2021 1428   LEUKOCYTESUR NEGATIVE 08/01/2021 1428   Sepsis Labs Invalid input(s): PROCALCITONIN,  WBC,  LACTICIDVEN Microbiology Recent Results (from the past 240 hour(s))  Resp Panel by RT-PCR (Flu A&B, Covid) Nasopharyngeal Swab     Status: None   Collection Time: 08/01/21  3:24 PM   Specimen: Nasopharyngeal Swab; Nasopharyngeal(NP) swabs in vial transport medium  Result Value Ref Range Status   SARS Coronavirus 2 by RT PCR NEGATIVE NEGATIVE Final    Comment: (NOTE) SARS-CoV-2 target nucleic acids are NOT DETECTED.  The SARS-CoV-2 RNA is generally detectable in upper respiratory specimens during the acute phase of infection. The lowest concentration of SARS-CoV-2 viral copies this assay can detect  is 138 copies/mL. A negative result does not preclude SARS-Cov-2 infection and should not be used as the sole basis for treatment or other patient management decisions. A negative result may occur with  improper specimen collection/handling, submission of specimen other than nasopharyngeal swab, presence of viral mutation(s) within the areas targeted by this assay, and inadequate number of viral copies(<138 copies/mL). A negative result must be combined with clinical observations, patient history, and epidemiological information. The expected result is  Negative.  Fact Sheet for Patients:  EntrepreneurPulse.com.au  Fact Sheet for Healthcare Providers:  IncredibleEmployment.be  This test is no t yet approved or cleared by the [place] FDA and  has been authorized for detection and/or diagnosis of SARS-CoV-2 by FDA under an Emergency Use Authorization (EUA). This EUA will remain  in effect (meaning this test can be used) for the duration of the COVID-19 declaration under Section 564(b)(1) of the Act, 21 U.S.C.section 360bbb-3(b)(1), unless the authorization is terminated  or revoked sooner.       Influenza A by PCR NEGATIVE NEGATIVE Final   Influenza B by PCR NEGATIVE NEGATIVE Final    Comment: (NOTE) The Xpert Xpress SARS-CoV-2/FLU/RSV plus assay is intended as an aid in the diagnosis of influenza from Nasopharyngeal swab specimens and should not be used as a sole basis for treatment. Nasal washings and aspirates are unacceptable for Xpert Xpress SARS-CoV-2/FLU/RSV testing.  Fact Sheet for Patients: EntrepreneurPulse.com.au  Fact Sheet for Healthcare Providers: IncredibleEmployment.be  This test is not yet approved or cleared by the [place] FDA and has been authorized for detection and/or diagnosis of SARS-CoV-2 by FDA under an Emergency Use Authorization (EUA). This EUA will remain in effect  (meaning this test can be used) for the duration of the COVID-19 declaration under Section 564(b)(1) of the Act, 21 U.S.C. section 360bbb-3(b)(1), unless the authorization is terminated or revoked.  Performed at [facility], [address], [place], [place] [address]     Procedures/Studies: CT CHEST W CONTRAST  Result Date: 08/02/2021 CLINICAL DATA:  Presumed new diagnosis pancreatic cancer staging, pulmonary nodules EXAM: CT CHEST WITH CONTRAST TECHNIQUE: Multidetector CT imaging of the chest was performed during intravenous contrast administration. RADIATION DOSE REDUCTION: This exam was performed according to the departmental dose-optimization program which includes automated exposure control, adjustment of the mA and/or kV according to patient size and/or use of iterative reconstruction technique. CONTRAST:  11mL OMNIPAQUE IOHEXOL 300 MG/ML  SOLN COMPARISON:  CT abdomen pelvis, 08/01/2021 FINDINGS: Cardiovascular: Aortic atherosclerosis. Normal heart size. Scattered coronary artery calcifications. No pericardial effusion. Mediastinum/Nodes: No enlarged mediastinal, hilar, or axillary lymph nodes. Thyroid gland, trachea, and esophagus demonstrate no significant findings. Lungs/Pleura: Innumerable bilateral pulmonary nodules of varying sizes, largest in the right lung base measuring 1.5 x 1.4 cm (series 5, image 111). Additional index nodule of the posterior right apex measures 1.3 x 1.2 cm (series 5, image 33). No pleural effusion or pneumothorax. Upper Abdomen: No acute abnormality. Biliary and pancreatic ductal dilatation partially imaged masslike lesion of the pancreatic head better assessed by prior dedicated examination of the abdomen. Musculoskeletal: No chest wall abnormality. No suspicious osseous lesions identified. IMPRESSION: 1. Innumerable bilateral pulmonary nodules of varying sizes, consistent with metastatic disease. 2. Biliary and pancreatic ductal dilatation as  well as partially imaged masslike lesion of the pancreatic head, better assessed by prior dedicated examinations of the abdomen. 3. Coronary artery disease. Aortic Atherosclerosis (ICD10-I70.0). Electronically Signed   By: Delanna Ahmadi M.D.   On: 08/02/2021 13:10   CT ABDOMEN PELVIS W CONTRAST  Result Date: 08/01/2021 CLINICAL DATA:  Nausea vomiting. EXAM: CT ABDOMEN AND PELVIS WITH CONTRAST TECHNIQUE: Multidetector CT imaging of the abdomen and pelvis was performed using the standard protocol following bolus administration of intravenous contrast. RADIATION DOSE REDUCTION: This exam was performed according to the departmental dose-optimization program which includes automated exposure control, adjustment of the mA and/or kV according to patient size and/or use of iterative reconstruction technique. CONTRAST:  145mL OMNIPAQUE  IOHEXOL 300 MG/ML  SOLN COMPARISON:  None. FINDINGS: Lower chest: Solid pulmonary nodules versus areas of consolidation in the right lower lobe, the largest measuring 1.5 cm. Multiple smaller solid subpleural pulmonary nodules in the left lung base. Hepatobiliary: Diffuse intrahepatic biliary ductal dilation. Normal appearance of the gallbladder. Mild distention of the proximal common bile duct. The distal common bile duct is difficult to visualize. Pancreas: Diffuse cystic dilation of the main pancreatic duct involving the body and tail of the pancreas. Bulbous heterogeneous appearance of the head of the pancreas. Spleen: Normal in size without focal abnormality. Adrenals/Urinary Tract: Adrenal glands are unremarkable. Kidneys are normal, without renal calculi, focal lesion, or hydronephrosis. Bladder is unremarkable. Stomach/Bowel: Stomach is within normal limits. Appendix appears normal. No evidence of bowel wall thickening, distention, or inflammatory changes. Vascular/Lymphatic: Aortic atherosclerosis. No enlarged abdominal or pelvic lymph nodes. 9 mm short axis lymph node in the  porta hepatic is noted. Reproductive: Uterus and bilateral adnexa are unremarkable. Other: No abdominal wall hernia or abnormality. No abdominopelvic ascites. Musculoskeletal: No acute or significant osseous findings. IMPRESSION: 1. Diffuse intrahepatic biliary ductal dilation with mild distention of the proximal common bile duct. The distal common bile duct is difficult to visualize. Bulbous heterogeneous appearance of the head of the pancreas. Cystic dilation of the main pancreatic duct in the body and head of the pancreas. Further evaluation with pancreatic mass protocol MRI of the abdomen may be considered. 2. Solid pulmonary nodules versus areas of consolidation in the right lower lobe, the largest measuring 1.5 cm. Multiple smaller solid subpleural pulmonary nodules in the left lung base. Metastatic disease cannot be excluded. Further evaluation with complete chest CT, or PET-CT if the imaging of the abdomen is positive for malignancy, may be considered. 3. 9 mm short axis lymph node in the porta hepatic region, indeterminate. 4. Aortic atherosclerosis. Aortic Atherosclerosis (ICD10-I70.0). Electronically Signed   By: Fidela Salisbury M.D.   On: 08/01/2021 16:35   MR 3D Recon At Scanner  Result Date: 08/02/2021 CLINICAL DATA:  Nausea, vomiting, biliary ductal dilatation, pancreatic ductal dilatation, possible pancreatic head mass EXAM: MRI ABDOMEN WITHOUT AND WITH CONTRAST (INCLUDING MRCP) TECHNIQUE: Multiplanar multisequence MR imaging of the abdomen was performed both before and after the administration of intravenous contrast. Heavily T2-weighted images of the biliary and pancreatic ducts were obtained, and three-dimensional MRCP images were rendered by post processing. CONTRAST:  30mL GADAVIST GADOBUTROL 1 MMOL/ML IV SOLN COMPARISON:  CT abdomen pelvis, 08/01/2021 FINDINGS: Examination is generally limited by breath motion artifact throughout Lower chest: Numerous pulmonary nodules throughout the  included bilateral lung bases, poorly evaluated by MR. Hepatobiliary: No mass or other parenchymal abnormality identified. No gallstones. Moderate intra and extrahepatic biliary ductal dilatation, the common bile duct measuring up to 0.9 cm. The common bile duct is abruptly truncated in the superior pancreatic head. Pancreas: Masslike, slightly hypoenhancing appearance of the pancreatic head, measuring approximately 3.5 x 2.2 cm (series 3, image 20), with abrupt truncation of the pancreatic duct in the superior pancreatic head, the pancreatic duct dilated distally up to 0.7 cm, with atrophy of the distal pancreatic parenchyma.No pancreatic ductal dilatation. Spleen:  Within normal limits in size and appearance. Adrenals/Urinary Tract: Normal adrenal glands. No renal masses or suspicious contrast enhancement identified. No evidence of hydronephrosis. Stomach/Bowel: Visualized portions within the abdomen are unremarkable. Vascular/Lymphatic: No discretely enlarged lymph nodes identified. There is abnormal contrast enhancing soft tissue about the celiac axis (series 21, image 39). No abdominal aortic aneurysm demonstrated. Breath  motion artifact significantly limits evaluation of the vessels in the vicinity of suspected pancreatic head mass; particularly the superior mesenteric vein, central splenic vein, and portal vein are not meaningfully assessed on this examination but were patent on prior CT dated 08/01/2021. Other:  None. Musculoskeletal: No suspicious osseous lesions identified. IMPRESSION: 1. Masslike, slightly hypoenhancing appearance of the pancreatic head, measuring approximately 3.5 x 2.2 cm, with abrupt truncation of the common bile duct and pancreatic duct in the superior pancreatic head, highly suspicious for pancreatic adenocarcinoma. 2. There are no discretely enlarged lymph nodes, however there is abnormal contrast enhancing soft tissue about the celiac axis, suspicious for nodal metastatic disease.  3. Numerous pulmonary nodules throughout the included bilateral lung bases, poorly evaluated by MR, better seen by prior CT, however these remain highly suspicious for pulmonary metastases. 4. Due to breath motion artifact, the vascular structures, particularly the superior mesenteric, central splenic, and portal veins are poorly assessed, but were patent on prior CT. 5. Due to significant limitations of breath motion artifact on this examination, consider contrast enhanced CT for future follow-up and restaging imaging by CT. Electronically Signed   By: Delanna Ahmadi M.D.   On: 08/02/2021 09:35   DG ERCP WITH SPHINCTEROTOMY  Result Date: 08/03/2021 CLINICAL DATA:  Probable malignant obstructed jaundice secondary to pancreatic mass EXAM: ERCP TECHNIQUE: Multiple spot images obtained with the fluoroscopic device and submitted for interpretation post-procedure. FLUOROSCOPY TIME:  Fluoroscopy Time:  5 minutes 44 seconds reported Number of Acquired Spot Images: 0 COMPARISON:  MRCP 08/02/2021 FINDINGS: A total of 14 saved intraoperative images are submitted for review. The images depict a flexible duodenal scope in the descending duodenum with wire cannulation of the common bile duct. Mild biliary ductal dilatation with complete occlusion distally. On the final images, a self expanding biliary stent has been placed and is partially opened. IMPRESSION: 1. Complete obstruction of the distal common bile duct. 2. Successful placement of self expanding metallic biliary stent. These images were submitted for radiologic interpretation only. Please see the procedural report for the amount of contrast and the fluoroscopy time utilized. Electronically Signed   By: Jacqulynn Cadet M.D.   On: 08/03/2021 16:30   DG C-Arm 1-60 Min-No Report  Result Date: 08/03/2021 Fluoroscopy was utilized by the requesting physician.  No radiographic interpretation.   MR ABDOMEN MRCP W WO CONTAST  Result Date: 08/02/2021 CLINICAL DATA:   Nausea, vomiting, biliary ductal dilatation, pancreatic ductal dilatation, possible pancreatic head mass EXAM: MRI ABDOMEN WITHOUT AND WITH CONTRAST (INCLUDING MRCP) TECHNIQUE: Multiplanar multisequence MR imaging of the abdomen was performed both before and after the administration of intravenous contrast. Heavily T2-weighted images of the biliary and pancreatic ducts were obtained, and three-dimensional MRCP images were rendered by post processing. CONTRAST:  31mL GADAVIST GADOBUTROL 1 MMOL/ML IV SOLN COMPARISON:  CT abdomen pelvis, 08/01/2021 FINDINGS: Examination is generally limited by breath motion artifact throughout Lower chest: Numerous pulmonary nodules throughout the included bilateral lung bases, poorly evaluated by MR. Hepatobiliary: No mass or other parenchymal abnormality identified. No gallstones. Moderate intra and extrahepatic biliary ductal dilatation, the common bile duct measuring up to 0.9 cm. The common bile duct is abruptly truncated in the superior pancreatic head. Pancreas: Masslike, slightly hypoenhancing appearance of the pancreatic head, measuring approximately 3.5 x 2.2 cm (series 3, image 20), with abrupt truncation of the pancreatic duct in the superior pancreatic head, the pancreatic duct dilated distally up to 0.7 cm, with atrophy of the distal pancreatic parenchyma.No pancreatic ductal  dilatation. Spleen:  Within normal limits in size and appearance. Adrenals/Urinary Tract: Normal adrenal glands. No renal masses or suspicious contrast enhancement identified. No evidence of hydronephrosis. Stomach/Bowel: Visualized portions within the abdomen are unremarkable. Vascular/Lymphatic: No discretely enlarged lymph nodes identified. There is abnormal contrast enhancing soft tissue about the celiac axis (series 21, image 39). No abdominal aortic aneurysm demonstrated. Breath motion artifact significantly limits evaluation of the vessels in the vicinity of suspected pancreatic head mass;  particularly the superior mesenteric vein, central splenic vein, and portal vein are not meaningfully assessed on this examination but were patent on prior CT dated 08/01/2021. Other:  None. Musculoskeletal: No suspicious osseous lesions identified. IMPRESSION: 1. Masslike, slightly hypoenhancing appearance of the pancreatic head, measuring approximately 3.5 x 2.2 cm, with abrupt truncation of the common bile duct and pancreatic duct in the superior pancreatic head, highly suspicious for pancreatic adenocarcinoma. 2. There are no discretely enlarged lymph nodes, however there is abnormal contrast enhancing soft tissue about the celiac axis, suspicious for nodal metastatic disease. 3. Numerous pulmonary nodules throughout the included bilateral lung bases, poorly evaluated by MR, better seen by prior CT, however these remain highly suspicious for pulmonary metastases. 4. Due to breath motion artifact, the vascular structures, particularly the superior mesenteric, central splenic, and portal veins are poorly assessed, but were patent on prior CT. 5. Due to significant limitations of breath motion artifact on this examination, consider contrast enhanced CT for future follow-up and restaging imaging by CT. Electronically Signed   By: Delanna Ahmadi M.D.   On: 08/02/2021 09:35   US Abdomen Limited RUQ (LIVER/GB)  Result Date: 08/01/2021 CLINICAL DATA:  Jaundice. Itching skin. Generalized abdominal pain for 6 days. EXAM: ULTRASOUND ABDOMEN LIMITED RIGHT UPPER QUADRANT COMPARISON:  05/07/2004. FINDINGS: Gallbladder: Distended, but with no stones or wall thickening or pericholecystic fluid. There is a small amount of gallbladder sludge and the patient is tender over the gallbladder to transducer pressure. Common bile duct: Diameter: 7 mm.  Distal duct not visualized. Liver: Normal in size and overall echogenicity. No mass. Mild central intrahepatic bile duct dilation. Portal vein is patent on color Doppler imaging with  normal direction of blood flow towards the liver. Other: None. IMPRESSION: 1. Common bile duct measures 7 mm and there is mild central intrahepatic bile duct dilation. The distal common bile duct is not well visualized. Cannot exclude a distal duct stone or other obstructing lesion. Given the history of jaundice, consider follow-up MRCP/ERCP. 2. Distended gallbladder, no stones and no convincing acute cholecystitis. Electronically Signed   By: Lajean Manes M.D.   On: 08/01/2021 16:01     Time coordinating discharge: Over 30 minutes    Dwyane Dee, MD  [facility] 08/04/2021, 7:36 PM

## 2021-08-05 ENCOUNTER — Other Ambulatory Visit: Payer: Self-pay | Admitting: Oncology

## 2021-08-05 DIAGNOSIS — K8689 Other specified diseases of pancreas: Secondary | ICD-10-CM

## 2021-08-05 NOTE — TOC Transition Note (Signed)
Transition of Care [facility]) - CM/SW Discharge Note   Patient Details  Name: Annette Key MRN: [id number] Date of Birth: 1940/08/20  Transition of Care [facility]) CM/SW Contact:  Sanford Lindblad, Marjie Skiff, RN Phone Number: 08/05/2021, 10:01 AM   Clinical Narrative:    Message was left for me this morning from RN yesterday that pt wanted home hospice services set up but family did not want to wait to be discharged. Called and spoke with [name] [phone] to explain how home hospice worked and to offer choice of home hospice companies. [facility] chosen and Dietitian from [facility] contacted for referral.

## 2021-08-06 ENCOUNTER — Other Ambulatory Visit: Payer: Self-pay | Admitting: Hematology and Oncology

## 2021-08-06 NOTE — Progress Notes (Signed)
I tried to call Mr Liebig to discuss about recommendations of genetic testing. No answer, left a voicemail that genetic testing wont be covered while patient in hospice If they are still willing to proceed forward, there will be an out of pocket cost, and left our phone number to call back if they would like [place] to place a referral.  Cordelia Bessinger

## 2021-10-04 ENCOUNTER — Encounter: Payer: Self-pay | Admitting: Internal Medicine

## 2021-10-04 ENCOUNTER — Non-Acute Institutional Stay (SKILLED_NURSING_FACILITY): Payer: PPO | Admitting: Internal Medicine

## 2021-10-04 DIAGNOSIS — K831 Obstruction of bile duct: Secondary | ICD-10-CM | POA: Diagnosis not present

## 2021-10-04 DIAGNOSIS — C259 Malignant neoplasm of pancreas, unspecified: Secondary | ICD-10-CM

## 2021-10-04 DIAGNOSIS — Z515 Encounter for palliative care: Secondary | ICD-10-CM

## 2021-10-04 DIAGNOSIS — C7800 Secondary malignant neoplasm of unspecified lung: Secondary | ICD-10-CM | POA: Diagnosis not present

## 2021-10-04 NOTE — Progress Notes (Signed)
?Provider:   ?Location:  [place] ?  ?Place of Service:  SNF (31) ? ?PCP: Virgie Dad, MD ?Patient Care Team: ?Virgie Dad, MD as PCP - General (Internal Medicine) ? ?Extended Emergency Contact Information ?Primary Emergency Contact: Laughter,Charles ?Address: [address] ?         [place] [address] [place] ?Home Phone: [phone] ?Mobile Phone: [phone] ?Relation: Spouse ?Secondary Emergency Contact: Goley,Kristin ?Mobile Phone: [phone] ?Relation: Daughter ?Interpreter needed? No ? ?Code Status: Hospice/DNR ?Goals of Care: Advanced Directive information ?Advanced Directives 08/01/2021  ?Does Patient Have a Medical Advance Directive? No  ?Would patient like information on creating a medical advance directive? No - Patient declined  ? ? ? ? ?Chief Complaint  ?Patient presents with  ? New Admit To SNF  ? ? ?HPI: Patient is a 80 y.o. female seen today for admission to Rehab for end of life ? ? ?Admitted in The hospital from 01/15-01/18 for Abdominal Pain  ?She has Past history of Anxiety and Depression and Hypotension ?Went to the hospital for Abdominal Pain and Jaundice Work up showed Pancreatic Cancer with Ductal obstruction. ?Underwent ERCP with Stent Placement on 08/03/21 ?Family refused Chemo ?She was discharged with Hospice ?Now admitted to Rehab in [place] for end of life care ?Patient was lethargic and Unable to give me any history ?Per husband she had very hard  time with agitation at home and now is more lethargic on Haldol ?Did not seem in any discomfort ?Also Having Urinary retention ?Refused In and out cath ? ? ? ? ?Past Medical History:  ?Diagnosis Date  ? Allergic rhinitis   ? Hypotension 04/16/2020  ? ?Past Surgical History:  ?Procedure Laterality Date  ? BILIARY STENT PLACEMENT N/A 08/03/2021  ? Procedure: BILIARY STENT PLACEMENT;  Surgeon: Carol Ada, MD;  Location: [facility]OSCOPY;  Service: Endoscopy;  Laterality: N/A;  ? ERCP N/A 08/03/2021  ?  Procedure: ENDOSCOPIC RETROGRADE CHOLANGIOPANCREATOGRAPHY (ERCP);  Surgeon: Carol Ada, MD;  Location: [facility];  Service: Endoscopy;  Laterality: N/A;  ? ESOPHAGOGASTRODUODENOSCOPY N/A 08/03/2021  ? Procedure: ESOPHAGOGASTRODUODENOSCOPY (EGD);  Surgeon: Carol Ada, MD;  Location: [facility];  Service: Endoscopy;  Laterality: N/A;  ? EUS N/A 08/03/2021  ? Procedure: UPPER ENDOSCOPIC ULTRASOUND (EUS) LINEAR;  Surgeon: Carol Ada, MD;  Location: [facility];  Service: Endoscopy;  Laterality: N/A;  ? FINE NEEDLE ASPIRATION N/A 08/03/2021  ? Procedure: FINE NEEDLE ASPIRATION (FNA) LINEAR;  Surgeon: Carol Ada, MD;  Location: [facility];  Service: Endoscopy;  Laterality: N/A;  ? SPHINCTEROTOMY  08/03/2021  ? Procedure: SPHINCTEROTOMY;  Surgeon: Carol Ada, MD;  Location: [facility];  Service: Endoscopy;;  ? ? reports that she has never smoked. She has never used smokeless tobacco. She reports that she does not drink alcohol and does not use drugs. ?Social History  ? ?Socioeconomic History  ? Marital status: Married  ?  Spouse name: Not on file  ? Number of children: Not on file  ? Years of education: Not on file  ? Highest education level: Not on file  ?Occupational History  ? Not on file  ?Tobacco Use  ? Smoking status: Never  ? Smokeless tobacco: Never  ?Vaping Use  ? Vaping Use: Never used  ?Substance and Sexual Activity  ? Alcohol use: No  ? Drug use: No  ? Sexual activity: Not on file  ?Other Topics Concern  ? Not on file  ?Social History Narrative  ? Not on file  ? ?Social Determinants of  Health  ? ?Financial Resource Strain: Not on file  ?Food Insecurity: Not on file  ?Transportation Needs: Not on file  ?Physical Activity: Not on file  ?Stress: Not on file  ?Social Connections: Not on file  ?Intimate Partner Violence: Not on file  ? ? ?Functional Status Survey: ?  ? ?Family History  ?Problem Relation Age of Onset  ? Hypertension Mother   ? Throat cancer Father   ? Cancer Father   ? Pancreatic  cancer Sister   ? Heart attack Brother   ? Breast cancer Neg Hx   ? ? ?Health Maintenance  ?Topic Date Due  ? TETANUS/TDAP  Never done  ? Zoster Vaccines- Shingrix (1 of 2) Never done  ? Pneumonia Vaccine 72+ Years old (2 - PCV) 11/26/2013  ? COVID-19 Vaccine (3 - Pfizer risk series) 09/28/2019  ? INFLUENZA VACCINE  02/15/2021  ? DEXA SCAN  Completed  ? HPV VACCINES  Aged Out  ? ? ?Allergies  ?Allergen Reactions  ? Sulfa Antibiotics Palpitations  ? ? ?Outpatient Encounter Medications as of 10/04/2021  ?Medication Sig  ? acetaminophen (TYLENOL) 500 MG tablet Take 500 mg by mouth every 6 (six) hours as needed.  ? haloperidol (HALDOL) 5 MG tablet Take 5 mg by mouth 4 (four) times daily. Hold for sedation  ? LORazepam (ATIVAN) 0.5 MG tablet Take 0.5 mg by mouth every 4 (four) hours as needed for anxiety.  ? morphine (ROXANOL) 20 MG/ML concentrated solution Take 5 mg by mouth every 2 (two) hours as needed for severe pain.  ? pantoprazole (PROTONIX) 40 MG tablet Take 40 mg by mouth daily.  ? sorbitol 70 % solution Take 30 mLs by mouth every 2 (two) hours as needed.  ? ?No facility-administered encounter medications on file as of 10/04/2021.  ? ? ?Review of Systems  ?Unable to perform ROS: Other  ? ?Vitals:  ? 10/05/21 1302  ?BP: 125/75  ?Pulse: 93  ?Resp: 16  ?Temp: 97.9 ?F (36.6 ?C)  ? ?There is no height or weight on file to calculate BMI. ?Physical Exam ?Vitals reviewed.  ?Constitutional:   ?   Appearance: Normal appearance.  ?   Comments: Somnolent  ?HENT:  ?   Head: Normocephalic.  ?   Nose: Nose normal.  ?   Mouth/Throat:  ?   Mouth: Mucous membranes are moist.  ?   Pharynx: Oropharynx is clear.  ?Eyes:  ?   Pupils: Pupils are equal, round, and reactive to light.  ?Cardiovascular:  ?   Rate and Rhythm: Normal rate and regular rhythm.  ?   Pulses: Normal pulses.  ?   Heart sounds: Normal heart sounds. No murmur heard. ?Pulmonary:  ?   Effort: Pulmonary effort is normal.  ?   Breath sounds: Normal breath sounds.   ?Abdominal:  ?   General: Abdomen is flat. Bowel sounds are normal. There is distension.  ?   Palpations: Abdomen is soft.  ?   Tenderness: There is no abdominal tenderness.  ?Musculoskeletal:     ?   General: No swelling.  ?   Cervical back: Neck supple.  ?Skin: ?   General: Skin is warm.  ?Neurological:  ?   General: No focal deficit present.  ?Psychiatric:     ?   Mood and Affect: Mood normal.     ?   Thought Content: Thought content normal.  ? ? ?Labs reviewed: ?Basic Metabolic Panel: ?Recent Labs  ?  08/02/21 ?0510 08/03/21 ?7902 08/04/21 ?4097  ?  NA 134* 136 133*  ?K 4.3 4.6 4.0  ?CL 100 102 99  ?CO2 '26 29 27  '$ ?GLUCOSE 112* 113* 112*  ?BUN '14 9 10  '$ ?CREATININE 0.62 0.56 0.75  ?CALCIUM 8.8* 9.0 8.7*  ? ?Liver Function Tests: ?Recent Labs  ?  08/02/21 ?0510 08/03/21 ?3300 08/04/21 ?7622  ?AST 521* 501* 386*  ?ALT 1,130* 1,097* 960*  ?ALKPHOS 278* 297* 264*  ?BILITOT 10.2* 10.4* 5.8*  ?PROT 6.3* 6.2* 5.9*  ?ALBUMIN 3.1* 3.1* 3.1*  ? ?Recent Labs  ?  08/01/21 ?1444  ?LIPASE 26  ? ?No results for input(s): AMMONIA in the last 8760 hours. ?CBC: ?Recent Labs  ?  08/02/21 ?0510 08/03/21 ?6333 08/04/21 ?5456  ?WBC 7.2 6.0 9.8  ?HGB 12.4 12.3 11.6*  ?HCT 38.0 37.3 34.7*  ?MCV 90.9 91.0 89.0  ?PLT 237 226 201  ? ?Cardiac Enzymes: ?No results for input(s): CKTOTAL, CKMB, CKMBINDEX, TROPONINI in the last 8760 hours. ?BNP: ?Invalid input(s): POCBNP ?No results found for: HGBA1C ?No results found for: TSH ?No results found for: VITAMINB12 ?No results found for: FOLATE ?No results found for: IRON, TIBC, FERRITIN ? ?Imaging and Procedures obtained prior to SNF admission: ?CT CHEST W CONTRAST ? ?Result Date: 08/02/2021 ?CLINICAL DATA:  Presumed new diagnosis pancreatic cancer staging, pulmonary nodules EXAM: CT CHEST WITH CONTRAST TECHNIQUE: Multidetector CT imaging of the chest was performed during intravenous contrast administration. RADIATION DOSE REDUCTION: This exam was performed according to the departmental  dose-optimization program which includes automated exposure control, adjustment of the mA and/or kV according to patient size and/or use of iterative reconstruction technique. CONTRAST:  40m OMNIPAQUE IOHEXOL 300 MG/ML

## 2021-10-05 ENCOUNTER — Encounter: Payer: Self-pay | Admitting: Internal Medicine

## 2021-11-15 DEATH — deceased

## 2021-12-31 ENCOUNTER — Telehealth: Payer: Self-pay | Admitting: Internal Medicine

## 2021-12-31 NOTE — Telephone Encounter (Signed)
Per scanned document in media tab, pt deceased. DOD Nov 05, 2021 at 5:45 am.
